# Patient Record
Sex: Female | Born: 1985 | Race: White | Hispanic: No | Marital: Single | Smoking: Current every day smoker
Health system: Southern US, Community
[De-identification: ages and names within clinical notes are randomized; demographics above are authoritative.]

## PROBLEM LIST (undated history)

## (undated) DIAGNOSIS — B1920 Unspecified viral hepatitis C without hepatic coma: Secondary | ICD-10-CM

## (undated) DIAGNOSIS — I38 Endocarditis, valve unspecified: Secondary | ICD-10-CM

## (undated) DIAGNOSIS — N809 Endometriosis, unspecified: Secondary | ICD-10-CM

## (undated) DIAGNOSIS — F1910 Other psychoactive substance abuse, uncomplicated: Secondary | ICD-10-CM

## (undated) HISTORY — PX: EXTERNAL FIXATION ARM: SHX1552

---
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
## 2003-03-28 ENCOUNTER — Emergency Department (HOSPITAL_COMMUNITY): Admission: EM | Admit: 2003-03-28 | Discharge: 2003-03-28 | Payer: Self-pay | Admitting: Emergency Medicine

## 2003-03-28 ENCOUNTER — Encounter: Payer: Self-pay | Admitting: Emergency Medicine

## 2004-07-05 ENCOUNTER — Ambulatory Visit (HOSPITAL_COMMUNITY): Admission: RE | Admit: 2004-07-05 | Discharge: 2004-07-05 | Payer: Self-pay | Admitting: *Deleted

## 2004-11-28 ENCOUNTER — Inpatient Hospital Stay (HOSPITAL_COMMUNITY): Admission: AD | Admit: 2004-11-28 | Discharge: 2004-11-28 | Payer: Self-pay | Admitting: *Deleted

## 2004-12-04 ENCOUNTER — Inpatient Hospital Stay (HOSPITAL_COMMUNITY): Admission: AD | Admit: 2004-12-04 | Discharge: 2004-12-07 | Payer: Self-pay | Admitting: Obstetrics & Gynecology

## 2004-12-10 ENCOUNTER — Ambulatory Visit: Payer: Self-pay | Admitting: Family Medicine

## 2004-12-10 ENCOUNTER — Inpatient Hospital Stay (HOSPITAL_COMMUNITY): Admission: AD | Admit: 2004-12-10 | Discharge: 2004-12-13 | Payer: Self-pay | Admitting: Family Medicine

## 2005-05-17 ENCOUNTER — Emergency Department (HOSPITAL_COMMUNITY): Admission: EM | Admit: 2005-05-17 | Discharge: 2005-05-17 | Payer: Self-pay | Admitting: Family Medicine

## 2008-02-23 ENCOUNTER — Emergency Department (HOSPITAL_COMMUNITY): Admission: EM | Admit: 2008-02-23 | Discharge: 2008-02-23 | Payer: Self-pay | Admitting: Emergency Medicine

## 2008-10-16 ENCOUNTER — Emergency Department (HOSPITAL_COMMUNITY): Admission: EM | Admit: 2008-10-16 | Discharge: 2008-10-16 | Payer: Self-pay | Admitting: Emergency Medicine

## 2010-02-01 ENCOUNTER — Emergency Department (HOSPITAL_COMMUNITY): Admission: EM | Admit: 2010-02-01 | Discharge: 2010-02-02 | Payer: Self-pay | Admitting: Emergency Medicine

## 2010-06-05 ENCOUNTER — Emergency Department: Payer: Self-pay | Admitting: Internal Medicine

## 2010-06-09 ENCOUNTER — Emergency Department: Payer: Self-pay | Admitting: Emergency Medicine

## 2010-12-04 LAB — CBC
HCT: 38.1 % (ref 36.0–46.0)
Hemoglobin: 13.6 g/dL (ref 12.0–15.0)
MCHC: 35.6 g/dL (ref 30.0–36.0)
MCV: 85.1 fL (ref 78.0–100.0)
Platelets: 227 10*3/uL (ref 150–400)
RBC: 4.48 MIL/uL (ref 3.87–5.11)
RDW: 12.3 % (ref 11.5–15.5)
WBC: 11.1 10*3/uL — ABNORMAL HIGH (ref 4.0–10.5)

## 2010-12-04 LAB — DIFFERENTIAL
Basophils Absolute: 0 10*3/uL (ref 0.0–0.1)
Basophils Relative: 0 % (ref 0–1)
Eosinophils Absolute: 0.1 10*3/uL (ref 0.0–0.7)
Eosinophils Relative: 1 % (ref 0–5)
Lymphocytes Relative: 14 % (ref 12–46)
Lymphs Abs: 1.6 10*3/uL (ref 0.7–4.0)
Monocytes Absolute: 0.5 10*3/uL (ref 0.1–1.0)
Monocytes Relative: 4 % (ref 3–12)
Neutro Abs: 8.9 10*3/uL — ABNORMAL HIGH (ref 1.7–7.7)
Neutrophils Relative %: 80 % — ABNORMAL HIGH (ref 43–77)

## 2010-12-04 LAB — URINALYSIS, ROUTINE W REFLEX MICROSCOPIC
Bilirubin Urine: NEGATIVE
Glucose, UA: NEGATIVE mg/dL
Hgb urine dipstick: NEGATIVE
Ketones, ur: 15 mg/dL — AB
Nitrite: NEGATIVE
Protein, ur: NEGATIVE mg/dL
Specific Gravity, Urine: 1.014 (ref 1.005–1.030)
Urobilinogen, UA: 1 mg/dL (ref 0.0–1.0)
pH: 7.5 (ref 5.0–8.0)

## 2010-12-04 LAB — BASIC METABOLIC PANEL
BUN: 9 mg/dL (ref 6–23)
CO2: 26 mEq/L (ref 19–32)
Calcium: 9.1 mg/dL (ref 8.4–10.5)
Chloride: 106 mEq/L (ref 96–112)
Creatinine, Ser: 0.74 mg/dL (ref 0.4–1.2)
GFR calc Af Amer: >60 mL/min (ref >60)
GFR calc non Af Amer: >60 mL/min (ref >60)
Glucose, Bld: 107 mg/dL — ABNORMAL HIGH (ref 70–99)
Potassium: 4.1 mEq/L (ref 3.5–5.1)
Sodium: 136 mEq/L (ref 135–145)

## 2010-12-04 LAB — PREGNANCY, URINE: Preg Test, Ur: NEGATIVE

## 2011-01-08 ENCOUNTER — Emergency Department (HOSPITAL_BASED_OUTPATIENT_CLINIC_OR_DEPARTMENT_OTHER)
Admission: EM | Admit: 2011-01-08 | Discharge: 2011-01-08 | Disposition: A | Discharge status: Home / Self Care | Payer: Medicaid Other | Attending: Emergency Medicine | Admitting: Emergency Medicine

## 2011-01-08 DIAGNOSIS — K089 Disorder of teeth and supporting structures, unspecified: Secondary | ICD-10-CM | POA: Insufficient documentation

## 2011-01-08 DIAGNOSIS — K029 Dental caries, unspecified: Secondary | ICD-10-CM | POA: Insufficient documentation

## 2011-01-08 DIAGNOSIS — F17200 Nicotine dependence, unspecified, uncomplicated: Secondary | ICD-10-CM | POA: Insufficient documentation

## 2011-02-02 NOTE — Discharge Summary (Signed)
NAMEMANAMI, TUTOR           ACCOUNT NO.:  [id number]   MEDICAL RECORD NO.:  [id number]          PATIENT TYPE:  INP   LOCATION:  9304                          FACILITY:  [facility]   PHYSICIAN:  Jon Gills, M.D.  DATE OF BIRTH:  1986-06-29   DATE OF ADMISSION:  12/10/2004  DATE OF DISCHARGE:  12/13/2004                                 DISCHARGE SUMMARY   ADMISSION DIAGNOSES:  10.  A 25 year old G2 P1-0-1-1, postpartum day seven status post vaginal      delivery with second-degree laceration and repair.  2.  Fevers.  3.  Copious vaginal discharge with odor.   DISCHARGE DIAGNOSES:  65.  A 25 year old G2 P1-0-1-1, postpartum day 10, status post vaginal      delivery with second-degree and repair.  2.  Postpartum wound infection.   DISCHARGE MEDICATIONS:  1.  Ceftin 500 mg p.o. q.8 h. x 7 days.  2.  Prenatal vitamin 1 p.o. daily.  3.  Ibuprofen 600 mg q.6 h. p.r.n.   ADMISSION HISTORY:  Maria Abbott was admitted to the hospital with fevers  to 102 and a vaginal discharge with odor.  There was minimal breakdown of  her perineal repair, with no obvious hematoma.  She was placed on IV  antibiotics.   HOSPITAL COURSE:  She defervesced 24 hours after being on IV antibiotics.  Urine culture showed no growth.  GC and Chlamydia were negative.  Vaginal  wound culture was consistent with normal vaginal flora and showed gram  negative rods and a few gram positive cocci.   CONDITION ON DISCHARGE:  The patient was discharged to home in stable  condition.   The patient was told to follow up at [facility] and return to [facility] if she  continued to have fevers or abnormal vaginal discharge.  She was told not to  do any heavy lifting for four weeks, and no intercourse for six weeks.      LC/MEDQ  D:  12/13/2004  T:  12/13/2004  Job:  [id number]

## 2011-03-14 ENCOUNTER — Emergency Department: Payer: Self-pay | Admitting: Unknown Physician Specialty

## 2011-04-29 ENCOUNTER — Encounter: Payer: Self-pay | Admitting: *Deleted

## 2011-04-29 ENCOUNTER — Emergency Department (HOSPITAL_BASED_OUTPATIENT_CLINIC_OR_DEPARTMENT_OTHER)
Admission: EM | Admit: 2011-04-29 | Discharge: 2011-04-29 | Disposition: A | Discharge status: Home / Self Care | Payer: Medicaid Other | Attending: Emergency Medicine | Admitting: Emergency Medicine

## 2011-04-29 DIAGNOSIS — N390 Urinary tract infection, site not specified: Secondary | ICD-10-CM

## 2011-04-29 DIAGNOSIS — R300 Dysuria: Secondary | ICD-10-CM | POA: Insufficient documentation

## 2011-04-29 LAB — URINALYSIS, ROUTINE W REFLEX MICROSCOPIC
Bilirubin Urine: NEGATIVE
Glucose, UA: NEGATIVE mg/dL
Ketones, ur: NEGATIVE mg/dL
Nitrite: NEGATIVE
Protein, ur: 30 mg/dL — AB
Specific Gravity, Urine: 1.009 (ref 1.005–1.030)
Urobilinogen, UA: 0.2 mg/dL (ref 0.0–1.0)
pH: 6 (ref 5.0–8.0)

## 2011-04-29 LAB — URINE MICROSCOPIC-ADD ON

## 2011-04-29 LAB — PREGNANCY, URINE: Preg Test, Ur: NEGATIVE

## 2011-04-29 MED ORDER — CIPROFLOXACIN HCL 500 MG PO TABS
500.0000 mg | ORAL_TABLET | Freq: Once | ORAL | Status: AC
  Administered 2011-04-29: 500 mg via ORAL
  Filled 2011-04-29: qty 1

## 2011-04-29 MED ORDER — PHENAZOPYRIDINE HCL 100 MG PO TABS
200.0000 mg | ORAL_TABLET | Freq: Once | ORAL | Status: AC
  Administered 2011-04-29: 200 mg via ORAL
  Filled 2011-04-29: qty 2

## 2011-04-29 MED ORDER — CIPROFLOXACIN HCL 500 MG PO TABS: 500.0000 mg | ORAL_TABLET | Freq: Two times a day (BID) | ORAL | Status: AC

## 2011-04-29 MED ORDER — NAPROXEN SODIUM 220 MG PO TABS: 220.0000 mg | ORAL_TABLET | Freq: Two times a day (BID) | ORAL | Status: DC

## 2011-04-29 MED ORDER — PHENAZOPYRIDINE HCL 100 MG PO TABS: 100.0000 mg | ORAL_TABLET | Freq: Three times a day (TID) | ORAL | Status: AC | PRN

## 2011-04-29 MED ORDER — NAPROXEN 250 MG PO TABS
250.0000 mg | ORAL_TABLET | Freq: Once | ORAL | Status: AC
  Administered 2011-04-29: 250 mg via ORAL
  Filled 2011-04-29: qty 1

## 2011-04-29 NOTE — ED Provider Notes (Signed)
History   Scribed for Olivia Mackie, MD, the patient was seen in room MH06/MH06 . This chart was scribed by Desma Paganini. This patient's care was started at 7:27 PM   CSN: [id number] Arrival date & time: 04/29/2011  7:23 PM  Chief Complaint  Patient presents with  . Urinary Frequency   HPI Maria Abbott is a 25 y.o. female who presents to the Emergency Department complaining of dysuria. Pt reports painful and frequent urination x 2 days. The pt has a h/o UTI with these same sx (last 8 months ago) and normally takes Azo-cranberry to relieve sx of dysuria, frequency, and mild suprapubic pain; tried this time with no relief. Pt states she had a fever of 100.3 yesterday but no fever today after taking tylenol. Denies back pain, n/v, and vaginal bleeding or discharge. Pt was last on abx for UTI approx 1 year ago. She currently uses a IUD, does not have NMP, and reports 1 sexual partner (spouse).  HPI ELEMENTS: Location: suprapubic Onset: 2 days ago Duration: 2 days ago   Timing: constant Severity: moderate Modifying factors:Took Tylenol with relief of fever; not improved with azo-cranberry Context:  as above  Associated symptoms: urinary frequency, mild suprapubic pain   PAST MEDICAL HISTORY:  History reviewed. No pertinent past medical history.   PAST SURGICAL HISTORY:  Past Surgical History  Procedure Date  . External fixation arm      MEDICATIONS:  Previous Medications   No medications on file     ALLERGIES:  Allergies as of 04/29/2011  . (Not on File)     FAMILY HISTORY:  History reviewed. No pertinent family history.   SOCIAL HISTORY: Lives with spouse and 2 children Doesn't use condoms   Review of Systems 10 Systems reviewed and are negative for acute change except as noted in the HPI.  Physical Exam  BP 94/75  Pulse 73  Temp(Src) 98.8 F (37.1 C) (Oral)  Resp 18  Ht 5' 3.5" (1.613 m)  Wt 128 lb (58.06 kg)  BMI 22.32 kg/m2  SpO2 99%  Physical  Exam  Nursing note and vitals reviewed. Constitutional: She is oriented to person, place, and time. She appears well-developed and well-nourished.  HENT:  Head: Normocephalic and atraumatic.  Eyes: EOM are normal. Pupils are equal, round, and reactive to light.  Neck: Normal range of motion. Neck supple.  Cardiovascular: Normal rate, normal heart sounds and intact distal pulses.   Pulmonary/Chest: Effort normal and breath sounds normal.  Abdominal: Soft. Bowel sounds are normal. She exhibits no distension and no mass. There is no tenderness. There is no CVA tenderness.  Genitourinary:       Suprapubic tenderness   Musculoskeletal: Normal range of motion. She exhibits no edema and no tenderness.  Neurological: She is alert and oriented to person, place, and time. No cranial nerve deficit.  Skin: Skin is warm and dry. No rash noted.  Psychiatric: She has a normal mood and affect.    ED Course  Procedures  OTHER DATA REVIEWED: Nursing notes and vital signs reviewed.  LABS / RADIOLOGY:  Results for orders placed during the hospital encounter of 04/29/11  PREGNANCY, URINE      Component Value Range   Preg Test, Ur NEGATIVE    URINALYSIS, ROUTINE W REFLEX MICROSCOPIC      Component Value Range   Color, Urine YELLOW  YELLOW    Appearance CLOUDY (*) CLEAR    Specific Gravity, Urine 1.009  1.005 - 1.030  pH 6.0  5.0 - 8.0    Glucose, UA NEGATIVE  NEGATIVE (mg/dL)   Hgb urine dipstick LARGE (*) NEGATIVE    Bilirubin Urine NEGATIVE  NEGATIVE    Ketones, ur NEGATIVE  NEGATIVE (mg/dL)   Protein, ur 30 (*) NEGATIVE (mg/dL)   Urobilinogen, UA 0.2  0.0 - 1.0 (mg/dL)   Nitrite NEGATIVE  NEGATIVE    Leukocytes, UA LARGE (*) NEGATIVE   URINE MICROSCOPIC-ADD ON      Component Value Range   Squamous Epithelial / LPF FEW (*) RARE    WBC, UA TOO NUMEROUS TO COUNT  <3 (WBC/hpf)   RBC / HPF TOO NUMEROUS TO COUNT  <3 (RBC/hpf)   Bacteria, UA MANY (*) RARE    .    MDM: Pt with 2 days of  dysuria, low grade temp.  No signs of serious bacterial infection on exam, no clinical pyelonephritis.  Will treat with cipro.    IMPRESSION: Urinary tract infection   PLAN:  Discharge home  The patient is to return the emergency department if there is any worsening of symptoms. I have reviewed the discharge instructions with the patient   CONDITION ON DISCHARGE: Stable   MEDICATIONS GIVEN IN THE E.D. Medications - No data to display   DISCHARGE MEDICATIONS: New Prescriptions   No medications on file           Olivia Mackie, MD 04/29/11 2037

## 2011-04-29 NOTE — ED Notes (Signed)
Pt states that she would like to get some "strong pain medication" to help with the pain because this UTI has been very painful and she would like to be able to sleep tonight.  I explained to the patient that narcotic pain medications are not indicated for the treatment of UTI's and that she will obtain greater relief with fewer side effects by using pyridium and antibiotics.

## 2011-04-29 NOTE — ED Notes (Signed)
Pt states she has had painful and frequent urination for 2 days. Normally takes Azo-Cranberry brand with relief, but not this time.

## 2011-05-01 LAB — URINE CULTURE
Colony Count: >=100000
Culture  Setup Time: 201208130019

## 2011-05-02 NOTE — ED Notes (Signed)
+   urine culture Patient treated with Cipro-sensitive to same-chart appended per protocol MD.

## 2011-06-30 ENCOUNTER — Encounter (HOSPITAL_BASED_OUTPATIENT_CLINIC_OR_DEPARTMENT_OTHER): Payer: Self-pay | Admitting: *Deleted

## 2011-06-30 ENCOUNTER — Emergency Department (HOSPITAL_BASED_OUTPATIENT_CLINIC_OR_DEPARTMENT_OTHER)
Admission: EM | Admit: 2011-06-30 | Discharge: 2011-07-01 | Disposition: A | Discharge status: Home / Self Care | Payer: Medicaid Other | Attending: Emergency Medicine | Admitting: Emergency Medicine

## 2011-06-30 DIAGNOSIS — Y9239 Other specified sports and athletic area as the place of occurrence of the external cause: Secondary | ICD-10-CM | POA: Insufficient documentation

## 2011-06-30 DIAGNOSIS — W010XXA Fall on same level from slipping, tripping and stumbling without subsequent striking against object, initial encounter: Secondary | ICD-10-CM | POA: Insufficient documentation

## 2011-06-30 DIAGNOSIS — Y9321 Activity, ice skating: Secondary | ICD-10-CM | POA: Insufficient documentation

## 2011-06-30 DIAGNOSIS — S300XXA Contusion of lower back and pelvis, initial encounter: Secondary | ICD-10-CM | POA: Insufficient documentation

## 2011-06-30 DIAGNOSIS — Y92838 Other recreation area as the place of occurrence of the external cause: Secondary | ICD-10-CM | POA: Insufficient documentation

## 2011-06-30 DIAGNOSIS — F17200 Nicotine dependence, unspecified, uncomplicated: Secondary | ICD-10-CM | POA: Insufficient documentation

## 2011-06-30 NOTE — ED Notes (Signed)
Pt states she fell at the skating rink and injured her mid-lower back.

## 2011-07-01 ENCOUNTER — Emergency Department (INDEPENDENT_AMBULATORY_CARE_PROVIDER_SITE_OTHER): Payer: Medicaid Other

## 2011-07-01 DIAGNOSIS — W19XXXA Unspecified fall, initial encounter: Secondary | ICD-10-CM

## 2011-07-01 DIAGNOSIS — M545 Low back pain: Secondary | ICD-10-CM

## 2011-07-01 MED ORDER — OXYCODONE-ACETAMINOPHEN 5-325 MG PO TABS
2.0000 | ORAL_TABLET | Freq: Once | ORAL | Status: AC
  Administered 2011-07-01: 2 via ORAL
  Filled 2011-07-01: qty 2

## 2011-07-01 MED ORDER — OXYCODONE-ACETAMINOPHEN 5-325 MG PO TABS: 1.0000 | ORAL_TABLET | Freq: Four times a day (QID) | ORAL | Status: AC | PRN

## 2011-07-01 NOTE — ED Provider Notes (Signed)
History    Scribed for Hanley Seamen, MD, the patient was seen in room MH01/MH01. This chart was scribed by Katha Cabal. This patient's care was started at 12:48 PM.    CSN: [id number] Arrival date & time: 06/30/2011 11:35 PM  Chief Complaint  Patient presents with  . Fall    (Consider location/radiation/quality/duration/timing/severity/associated sxs/prior treatment) HPI Maria Abbott is a 25 y.o. female who presents to the Emergency Department for sudden fall with associated constant mid to lower back pain that occured while ice skating tonight at 10 PM.  Denies EtOH use.  Pain is aggravated by walking. Patient states that she fell backward and her feet went up into air landing on bottom of spine.    History reviewed. No pertinent past medical history.  Past Surgical History  Procedure Date  . External fixation arm     History reviewed. No pertinent family history.  History  Substance Use Topics  . Smoking status: Current Everyday Smoker -- 0.5 packs/day  . Smokeless tobacco: Not on file  . Alcohol Use: No    OB History    Grav Para Term Preterm Abortions TAB SAB Ect Mult Living                  Review of Systems  All other systems reviewed and are negative.    Allergies  Codeine; Penicillins; and Sulfa antibiotics  Home Medications   Current Outpatient Rx  Name Route Sig Dispense Refill  . ACETAMINOPHEN 500 MG PO TABS Oral Take 1,000 mg by mouth every 6 (six) hours as needed. For pain     . LEVONORGESTREL 20 MCG/24HR IU IUD Intrauterine 1 each by Intrauterine route once.      Marland Kitchen NAPROXEN SODIUM 220 MG PO TABS Oral Take 1 tablet (220 mg total) by mouth 2 (two) times daily with a meal. 20 tablet 0    BP 113/63  Pulse 98  Temp(Src) 97.9 F (36.6 C) (Oral)  Resp 10  Ht 5' 2.5" (1.588 m)  Wt 135 lb (61.236 kg)  BMI 24.30 kg/m2  SpO2 100%  Physical Exam General: Well-developed, well-nourished female in no acute distress; appearance consistent  with age of record HENT: normocephalic, atraumatic Eyes: pupils equal round and reactive to light; extraocular muscles intact Neck: supple Heart: regular rate and rhythm; no murmurs, rubs or gallops Lungs: clear to auscultation bilaterally Abdomen: soft; nontender; nondistended; no masses or hepatosplenomegaly;  Extremities: No deformity; full range of motion; pulses normal  Tenderness to lower sacrum and coccyx.  No palpable deformity, no crepitus.   Neurologic: Awake, alert and oriented;motor function intact in all extremities and symmetric; no facial droop,   No sensory deficit.   Skin: Warm and dry Psychiatric: Normal mood and affect   ED Course  Procedures (including critical care time)   DIAGNOSTIC STUDIES: Oxygen Saturation is 100% on room air, normal by my interpretation.    COORDINATION OF CARE:  12:54 AM  Physical exam complete.  Will review Sacrum/Coccyx XR.    Nursing notes and vitals signs, including pulse oximetry, reviewed.  Summary of this visit's results, reviewed by myself:   Imaging Studies: Dg Sacrum/coccyx  07/01/2011  *RADIOLOGY REPORT*  Clinical Data: Lower back pain after fall  SACRUM AND COCCYX - 2+ VIEW  Comparison: None.  Findings: The sacral struts appear intact.  SI joints and symphysis pubis are not widened.  Sacral coccygeal spine appears intact without depression on the lateral view.  Metallic structure consistent with intrauterine  device.  IMPRESSION: No acute bony abnormalities demonstrated.  Original Report Authenticated By: Marlon Pel, M.D.             Hanley Seamen, MD 07/01/11 [phone]

## 2011-08-02 ENCOUNTER — Emergency Department (HOSPITAL_BASED_OUTPATIENT_CLINIC_OR_DEPARTMENT_OTHER)
Admission: EM | Admit: 2011-08-02 | Discharge: 2011-08-02 | Disposition: A | Discharge status: Home / Self Care | Payer: Medicaid Other | Attending: Emergency Medicine | Admitting: Emergency Medicine

## 2011-08-02 ENCOUNTER — Encounter (HOSPITAL_BASED_OUTPATIENT_CLINIC_OR_DEPARTMENT_OTHER): Payer: Self-pay | Admitting: Emergency Medicine

## 2011-08-02 DIAGNOSIS — R319 Hematuria, unspecified: Secondary | ICD-10-CM | POA: Insufficient documentation

## 2011-08-02 DIAGNOSIS — R350 Frequency of micturition: Secondary | ICD-10-CM | POA: Insufficient documentation

## 2011-08-02 DIAGNOSIS — N760 Acute vaginitis: Secondary | ICD-10-CM

## 2011-08-02 DIAGNOSIS — B9689 Other specified bacterial agents as the cause of diseases classified elsewhere: Secondary | ICD-10-CM | POA: Insufficient documentation

## 2011-08-02 DIAGNOSIS — F17200 Nicotine dependence, unspecified, uncomplicated: Secondary | ICD-10-CM | POA: Insufficient documentation

## 2011-08-02 DIAGNOSIS — A499 Bacterial infection, unspecified: Secondary | ICD-10-CM | POA: Insufficient documentation

## 2011-08-02 LAB — URINALYSIS, ROUTINE W REFLEX MICROSCOPIC
Bilirubin Urine: NEGATIVE
Glucose, UA: NEGATIVE mg/dL
Ketones, ur: NEGATIVE mg/dL
Nitrite: NEGATIVE
Protein, ur: NEGATIVE mg/dL
Specific Gravity, Urine: 1.004 — ABNORMAL LOW (ref 1.005–1.030)
Urobilinogen, UA: 0.2 mg/dL (ref 0.0–1.0)
pH: 6 (ref 5.0–8.0)

## 2011-08-02 LAB — WET PREP, GENITAL
Trich, Wet Prep: NONE SEEN
Yeast Wet Prep HPF POC: NONE SEEN

## 2011-08-02 LAB — URINE MICROSCOPIC-ADD ON

## 2011-08-02 LAB — PREGNANCY, URINE: Preg Test, Ur: NEGATIVE

## 2011-08-02 MED ORDER — MICONAZOLE NITRATE 1200 & 2 MG & % VA KIT: 1.0000 | PACK | Freq: Once | VAGINAL | Status: AC

## 2011-08-02 NOTE — ED Provider Notes (Signed)
History     CSN: [id number] Arrival date & time: 08/02/2011 10:37 PM   First MD Initiated Contact with Patient 08/02/11 2258      Chief Complaint  Patient presents with  . Urinary Frequency  . Hematuria    (Consider location/radiation/quality/duration/timing/severity/associated sxs/prior treatment) HPI 25 year old female presents to emergency department with complaint of one day of urinary frequency, dysuria, and blood seen in urine. Patient reports symptoms are similar to prior UTI. Patient denies any new sexual partners, no vaginal discharge. No fevers no back pain History reviewed. No pertinent past medical history.  Past Surgical History  Procedure Date  . External fixation arm     No family history on file.  History  Substance Use Topics  . Smoking status: Current Everyday Smoker -- 0.5 packs/day  . Smokeless tobacco: Not on file  . Alcohol Use: No    OB History    Grav Para Term Preterm Abortions TAB SAB Ect Mult Living                  Review of Systems  All other systems reviewed and are negative.    Allergies  Codeine; Penicillins; and Sulfa antibiotics  Home Medications   Current Outpatient Rx  Name Route Sig Dispense Refill  . ACETAMINOPHEN 500 MG PO TABS Oral Take 1,000 mg by mouth every 6 (six) hours as needed. For pain    . PHENAZOPYRIDINE HCL 100 MG PO TABS Oral Take 100 mg by mouth 3 (three) times daily as needed. For UTI     . LEVONORGESTREL 20 MCG/24HR IU IUD Intrauterine 1 each by Intrauterine route once. Inserted in 2008      BP 113/74  Pulse 94  Temp(Src) 97.3 F (36.3 C) (Oral)  Resp 18  SpO2 100%  Physical Exam  Constitutional: She is oriented to person, place, and time. She appears well-developed and well-nourished.  HENT:  Head: Normocephalic and atraumatic.  Nose: Nose normal.  Mouth/Throat: Oropharynx is clear and moist.  Eyes: Conjunctivae and EOM are normal. Pupils are equal, round, and reactive to light.  Neck:  Normal range of motion. Neck supple. No JVD present. No tracheal deviation present. No thyromegaly present.  Cardiovascular: Normal rate, regular rhythm, normal heart sounds and intact distal pulses.  Exam reveals no gallop and no friction rub.   No murmur heard. Pulmonary/Chest: Effort normal and breath sounds normal. No stridor. No respiratory distress. She has no wheezes. She has no rales. She exhibits no tenderness.  Abdominal: Soft. Bowel sounds are normal. She exhibits no distension and no mass. There is no tenderness. There is no rebound and no guarding.  Genitourinary: Uterus normal. Vaginal discharge found.       Vagina noted to have thick white discharge and clumps consistent with yeast. No adnexal tenderness no uterine tenderness  Musculoskeletal: Normal range of motion. She exhibits no edema and no tenderness.  Lymphadenopathy:    She has no cervical adenopathy.  Neurological: She is oriented to person, place, and time. She has normal reflexes. No cranial nerve deficit. She exhibits normal muscle tone. Coordination normal.  Skin: Skin is dry. No rash noted. No erythema. No pallor.  Psychiatric: She has a normal mood and affect. Her behavior is normal. Judgment and thought content normal.    ED Course  Procedures (including critical care time)  Labs Reviewed  URINALYSIS, ROUTINE W REFLEX MICROSCOPIC - Abnormal; Notable for the following:    Specific Gravity, Urine 1.004 (*)    Hgb urine  dipstick SMALL (*)    Leukocytes, UA TRACE (*)    All other components within normal limits  WET PREP, GENITAL - Abnormal; Notable for the following:    Clue Cells, Wet Prep FEW (*)    WBC, Wet Prep HPF POC MANY (*)    All other components within normal limits  URINE MICROSCOPIC-ADD ON  PREGNANCY, URINE  URINE CULTURE  GC/CHLAMYDIA PROBE AMP, GENITAL   No results found.   No diagnosis found.    MDM  24 year old female with essentially normal urine, will send for cultures given her  symptoms. Wet prep does show yeast, however exam consistent with same. Will place her on over-the-counter Monistat and have her followup with her primary care Dr.        Olivia Mackie, MD 08/02/11 [phone]

## 2011-08-02 NOTE — ED Notes (Signed)
Pt c/o urinary frequency, pain with urination and hematuria

## 2011-08-02 NOTE — ED Notes (Signed)
Warm blankets given.

## 2011-08-04 LAB — URINE CULTURE
Colony Count: NO GROWTH
Culture  Setup Time: 201211160628
Culture: NO GROWTH

## 2011-08-04 LAB — GC/CHLAMYDIA PROBE AMP, GENITAL: GC Probe Amp, Genital: NEGATIVE

## 2011-10-18 ENCOUNTER — Emergency Department (INDEPENDENT_AMBULATORY_CARE_PROVIDER_SITE_OTHER): Payer: Medicaid Other

## 2011-10-18 ENCOUNTER — Encounter (HOSPITAL_BASED_OUTPATIENT_CLINIC_OR_DEPARTMENT_OTHER): Payer: Self-pay | Admitting: *Deleted

## 2011-10-18 ENCOUNTER — Emergency Department (HOSPITAL_BASED_OUTPATIENT_CLINIC_OR_DEPARTMENT_OTHER)
Admission: EM | Admit: 2011-10-18 | Discharge: 2011-10-18 | Disposition: A | Discharge status: Home / Self Care | Payer: Medicaid Other | Attending: Emergency Medicine | Admitting: Emergency Medicine

## 2011-10-18 DIAGNOSIS — M533 Sacrococcygeal disorders, not elsewhere classified: Secondary | ICD-10-CM | POA: Insufficient documentation

## 2011-10-18 DIAGNOSIS — F17200 Nicotine dependence, unspecified, uncomplicated: Secondary | ICD-10-CM | POA: Insufficient documentation

## 2011-10-18 DIAGNOSIS — W108XXA Fall (on) (from) other stairs and steps, initial encounter: Secondary | ICD-10-CM | POA: Insufficient documentation

## 2011-10-18 DIAGNOSIS — IMO0002 Reserved for concepts with insufficient information to code with codable children: Secondary | ICD-10-CM

## 2011-10-18 DIAGNOSIS — IMO0001 Reserved for inherently not codable concepts without codable children: Secondary | ICD-10-CM

## 2011-10-18 MED ORDER — HYDROCODONE-ACETAMINOPHEN 5-325 MG PO TABS
1.0000 | ORAL_TABLET | Freq: Once | ORAL | Status: AC
  Administered 2011-10-18: 1 via ORAL
  Filled 2011-10-18: qty 1

## 2011-10-18 MED ORDER — HYDROCODONE-ACETAMINOPHEN 5-500 MG PO TABS: 1.0000 | ORAL_TABLET | Freq: Four times a day (QID) | ORAL | Status: AC | PRN

## 2011-10-18 NOTE — ED Notes (Signed)
Fell down stairs yesterday injury to her lower back. Fell in October and hurt her coccyx. Has an appointment to a specialist February.

## 2011-10-18 NOTE — ED Provider Notes (Signed)
History     CSN: [id number]  Arrival date & time 10/18/11  1753   First MD Initiated Contact with Patient 10/18/11 1823      Chief Complaint  Patient presents with  . Fall    (Consider location/radiation/quality/duration/timing/severity/associated sxs/prior treatment) HPI Comments: Pt states that fell a couple of months ago and had and injury to the area  Patient is a 26 y.o. female presenting with fall. The history is provided by the patient. No language interpreter was used.  Fall The accident occurred yesterday. Incident: fell down some stairs. She landed on a hard floor. There was no blood loss. Point of impact: buttock. Pain location: buttock. The pain is moderate. She was ambulatory at the scene. There was no entrapment after the fall. There was no drug use involved in the accident. There was no alcohol use involved in the accident. Pertinent negatives include no nausea, no vomiting and no loss of consciousness. The symptoms are aggravated by activity. She has tried nothing for the symptoms.    History reviewed. No pertinent past medical history.  Past Surgical History  Procedure Date  . External fixation arm     No family history on file.  History  Substance Use Topics  . Smoking status: Current Everyday Smoker -- 0.5 packs/day  . Smokeless tobacco: Not on file  . Alcohol Use: No    OB History    Grav Para Term Preterm Abortions TAB SAB Ect Mult Living                  Review of Systems  Gastrointestinal: Negative for nausea and vomiting.  Neurological: Negative for loss of consciousness.  All other systems reviewed and are negative.    Allergies  Codeine; Penicillins; and Sulfa antibiotics  Home Medications   Current Outpatient Rx  Name Route Sig Dispense Refill  . ACETAMINOPHEN 500 MG PO TABS Oral Take 1,000 mg by mouth every 6 (six) hours as needed. For pain    . IBUPROFEN 200 MG PO TABS Oral Take 400 mg by mouth every 8 (eight) hours as needed.  For pain    . LEVONORGESTREL 20 MCG/24HR IU IUD Intrauterine 1 each by Intrauterine route once. Inserted in 2008      BP 101/69  Pulse 80  Temp(Src) 98.1 F (36.7 C) (Oral)  Resp 20  SpO2 99%  Physical Exam  Nursing note and vitals reviewed. Constitutional: She is oriented to person, place, and time. She appears well-developed and well-nourished.  HENT:  Head: Normocephalic and atraumatic.  Eyes: EOM are normal.  Cardiovascular: Normal rate and regular rhythm.   Pulmonary/Chest: Effort normal and breath sounds normal.  Musculoskeletal:       Cervical back: Normal.       Thoracic back: Normal.       Lumbar back: Normal.       Back:  Neurological: She is alert and oriented to person, place, and time.  Skin: Skin is warm and dry.  Psychiatric: She has a normal mood and affect.    ED Course  Procedures (including critical care time)  Labs Reviewed - No data to display No results found.   1. Coccyx pain       MDM  Previous x-ray was negative:pt is okay to follow up with pcp        Teressa Lower, NP 10/18/11 1946

## 2011-10-19 NOTE — ED Provider Notes (Signed)
Medical screening examination/treatment/procedure(s) were performed by non-physician practitioner and as supervising physician I was immediately available for consultation/collaboration.   Dione Booze, MD 10/19/11 0000

## 2012-05-29 ENCOUNTER — Encounter (HOSPITAL_BASED_OUTPATIENT_CLINIC_OR_DEPARTMENT_OTHER): Payer: Self-pay | Admitting: *Deleted

## 2012-05-29 ENCOUNTER — Emergency Department (HOSPITAL_BASED_OUTPATIENT_CLINIC_OR_DEPARTMENT_OTHER)
Admission: EM | Admit: 2012-05-29 | Discharge: 2012-05-29 | Disposition: A | Discharge status: Home / Self Care | Payer: Self-pay | Attending: Emergency Medicine | Admitting: Emergency Medicine

## 2012-05-29 DIAGNOSIS — F17200 Nicotine dependence, unspecified, uncomplicated: Secondary | ICD-10-CM | POA: Insufficient documentation

## 2012-05-29 DIAGNOSIS — N390 Urinary tract infection, site not specified: Secondary | ICD-10-CM | POA: Insufficient documentation

## 2012-05-29 LAB — URINE MICROSCOPIC-ADD ON

## 2012-05-29 LAB — URINALYSIS, ROUTINE W REFLEX MICROSCOPIC
Bilirubin Urine: NEGATIVE
Ketones, ur: NEGATIVE mg/dL
Protein, ur: NEGATIVE mg/dL
Urobilinogen, UA: 0.2 mg/dL (ref 0.0–1.0)

## 2012-05-29 MED ORDER — PHENAZOPYRIDINE HCL 200 MG PO TABS: 200.0000 mg | ORAL_TABLET | Freq: Three times a day (TID) | ORAL | Status: AC | PRN

## 2012-05-29 MED ORDER — CIPROFLOXACIN HCL 500 MG PO TABS: 500.0000 mg | ORAL_TABLET | Freq: Two times a day (BID) | ORAL | Status: AC

## 2012-05-29 NOTE — ED Provider Notes (Signed)
History     CSN: [id number]  Arrival date & time 05/29/12  1608   First MD Initiated Contact with Patient 05/29/12 1631      Chief Complaint  Patient presents with  . Urinary Tract Infection    (Consider location/radiation/quality/duration/timing/severity/associated sxs/prior treatment) HPI Comments: History of recurrent utis.  This feels the same.  Has mirena ring, doesn't have periods.  No discharge or bleeding.  Denies possibility of pregnancy.  Patient is a 26 y.o. female presenting with urinary tract infection. The history is provided by the patient.  Urinary Tract Infection This is a recurrent problem. The current episode started 2 days ago. The problem occurs constantly. The problem has been gradually worsening. Pertinent negatives include no abdominal pain. Exacerbated by: urinating. Nothing relieves the symptoms. Treatments tried: otc azo. The treatment provided no relief.    History reviewed. No pertinent past medical history.  Past Surgical History  Procedure Date  . External fixation arm     No family history on file.  History  Substance Use Topics  . Smoking status: Current Every Day Smoker -- 0.5 packs/day  . Smokeless tobacco: Not on file  . Alcohol Use: No    OB History    Grav Para Term Preterm Abortions TAB SAB Ect Mult Living                  Review of Systems  Gastrointestinal: Negative for abdominal pain.  All other systems reviewed and are negative.    Allergies  Codeine; Penicillins; and Sulfa antibiotics  Home Medications   Current Outpatient Rx  Name Route Sig Dispense Refill  . AZO-CRANBERRY PO Oral Take 2 tablets by mouth 3 (three) times daily as needed. For pain    . NAPROXEN SODIUM 220 MG PO TABS Oral Take 220 mg by mouth 2 (two) times daily as needed. For pain    . LEVONORGESTREL 20 MCG/24HR IU IUD Intrauterine 1 each by Intrauterine route once. Inserted in 2008      BP 110/65  Pulse 84  Temp 97.4 F (36.3 C) (Oral)  Resp  20  SpO2 100%  Physical Exam  Nursing note and vitals reviewed. Constitutional: She is oriented to person, place, and time. She appears well-developed and well-nourished.  HENT:  Head: Normocephalic and atraumatic.  Neck: Normal range of motion. Neck supple.  Cardiovascular: Normal rate and regular rhythm.   No murmur heard. Pulmonary/Chest: Effort normal and breath sounds normal.  Abdominal: Soft. Bowel sounds are normal.       There is mild suprapubic ttp.  No rebound or guarding.  Musculoskeletal: Normal range of motion.  Neurological: She is alert and oriented to person, place, and time.  Skin: Skin is warm and dry.    ED Course  Procedures (including critical care time)   Labs Reviewed  URINALYSIS, ROUTINE W REFLEX MICROSCOPIC  PREGNANCY, URINE   No results found.   No diagnosis found.    MDM  UA shows UTI.  Will treat with cipro, pyridium.         Geoffery Lyons, MD 05/29/12 1729

## 2012-05-29 NOTE — ED Notes (Signed)
Dysuria x 3 days. No relief with azo-cranberry which normally helps her when she gets a UTI.

## 2013-12-30 ENCOUNTER — Emergency Department (HOSPITAL_BASED_OUTPATIENT_CLINIC_OR_DEPARTMENT_OTHER)
Admission: EM | Admit: 2013-12-30 | Discharge: 2013-12-30 | Disposition: A | Discharge status: Home / Self Care | Payer: Medicaid Other | Attending: Emergency Medicine | Admitting: Emergency Medicine

## 2013-12-30 ENCOUNTER — Encounter (HOSPITAL_BASED_OUTPATIENT_CLINIC_OR_DEPARTMENT_OTHER): Payer: Self-pay | Admitting: Emergency Medicine

## 2013-12-30 DIAGNOSIS — Z3202 Encounter for pregnancy test, result negative: Secondary | ICD-10-CM | POA: Insufficient documentation

## 2013-12-30 DIAGNOSIS — F17200 Nicotine dependence, unspecified, uncomplicated: Secondary | ICD-10-CM | POA: Insufficient documentation

## 2013-12-30 DIAGNOSIS — N390 Urinary tract infection, site not specified: Secondary | ICD-10-CM

## 2013-12-30 DIAGNOSIS — Z880 Allergy status to penicillin: Secondary | ICD-10-CM | POA: Insufficient documentation

## 2013-12-30 DIAGNOSIS — K089 Disorder of teeth and supporting structures, unspecified: Secondary | ICD-10-CM | POA: Insufficient documentation

## 2013-12-30 LAB — URINALYSIS, ROUTINE W REFLEX MICROSCOPIC
GLUCOSE, UA: NEGATIVE mg/dL
KETONES UR: 15 mg/dL — AB
Nitrite: NEGATIVE
PH: 6 (ref 5.0–8.0)
Protein, ur: 30 mg/dL — AB
Specific Gravity, Urine: 1.029 (ref 1.005–1.030)
Urobilinogen, UA: 0.2 mg/dL (ref 0.0–1.0)

## 2013-12-30 LAB — URINE MICROSCOPIC-ADD ON

## 2013-12-30 LAB — PREGNANCY, URINE: Preg Test, Ur: NEGATIVE

## 2013-12-30 MED ORDER — CIPROFLOXACIN HCL 500 MG PO TABS: 500.0000 mg | ORAL_TABLET | Freq: Two times a day (BID) | ORAL | Status: DC

## 2013-12-30 NOTE — ED Notes (Signed)
Pt has UTI symptoms. She states she gets frequent uti's. Symptoms started yesterday, burning with urination, frequency

## 2013-12-30 NOTE — ED Provider Notes (Signed)
CSN: [id number]     Arrival date & time 12/30/13  [id number] History   None    Chief Complaint  Patient presents with  . Urinary Tract Infection     (Consider location/radiation/quality/duration/timing/severity/associated sxs/prior Treatment) HPI 28 year old woman with history of chronic UTIs presents with dysuria, frequency, hematuria.  She has been drinking less fluids lately due to a dental issue (dentistry follow-up already in place) which she believes caused her symptoms.  No fever, suprapubic or flank pain.  No vaginal discharge, Mirena IUD in place, in monogamous relationship x 4 years, recent STD tests negative.   History reviewed. No pertinent past medical history. Past Surgical History  Procedure Laterality Date  . External fixation arm     No family history on file. History  Substance Use Topics  . Smoking status: Current Every Day Smoker -- 0.50 packs/day  . Smokeless tobacco: Not on file  . Alcohol Use: No   OB History   Grav Para Term Preterm Abortions TAB SAB Ect Mult Living                 Review of Systems  Constitutional: Negative for fever.  HENT: Positive for dental problem.        Seeing dentist in 2 weeks  Respiratory: Negative for shortness of breath.   Cardiovascular: Negative for chest pain.  Gastrointestinal: Negative for nausea and abdominal pain.  Genitourinary: Positive for dysuria, urgency, frequency and hematuria. Negative for flank pain, vaginal discharge and pelvic pain.  Musculoskeletal: Negative for back pain.  Neurological: Negative for dizziness and syncope.    Allergies  Codeine; Penicillins; and Sulfa antibiotics  Home Medications   Prior to Admission medications   Medication Sig Start Date End Date Taking? Authorizing Provider  AZO-CRANBERRY PO Take 2 tablets by mouth 3 (three) times daily as needed. For pain    Historical Provider, MD  levonorgestrel (MIRENA) 20 MCG/24HR IUD 1 each by Intrauterine route once. Inserted in 2008     Historical Provider, MD  naproxen sodium (ANAPROX) 220 MG tablet Take 220 mg by mouth 2 (two) times daily as needed. For pain    Historical Provider, MD   BP 119/74  Pulse 92  Temp(Src) 98 F (36.7 C) (Oral)  Resp 18  SpO2 99% Physical Exam  Constitutional: She is oriented to person, place, and time. She appears well-developed and well-nourished. No distress.  HENT:  Head: Normocephalic and atraumatic.  Eyes: Conjunctivae are normal. Pupils are equal, round, and reactive to light.  Neck: Normal range of motion. Neck supple.  Cardiovascular: Normal rate and regular rhythm.   Pulmonary/Chest: Effort normal and breath sounds normal.  Abdominal: Soft. Bowel sounds are normal. There is no tenderness.  Musculoskeletal: Normal range of motion.  Neurological: She is alert and oriented to person, place, and time.  Skin: Skin is warm and dry.    ED Course  Procedures (including critical care time) Labs Review Labs Reviewed  URINALYSIS, ROUTINE W REFLEX MICROSCOPIC - Abnormal; Notable for the following:    APPearance CLOUDY (*)    Hgb urine dipstick LARGE (*)    Bilirubin Urine SMALL (*)    Ketones, ur 15 (*)    Protein, ur 30 (*)    Leukocytes, UA LARGE (*)    All other components within normal limits  URINE MICROSCOPIC-ADD ON - Abnormal; Notable for the following:    Squamous Epithelial / LPF FEW (*)    Bacteria, UA MANY (*)    All other  components within normal limits  PREGNANCY, URINE    Imaging Review No results found.   EKG Interpretation None      MDM   Final diagnoses:  UTI (lower urinary tract infection)  UTI- Patient presents with dysuria, frequency, hematuria, has chronic UTIs, usually treated with cipro.  UA showed large leucocytes, large heme, WBC TNTC, many bacteria.  Urine pregnancy negative.   Discharged with ciprofloxacin 500 mg BID x 7 days, PCP follow-up.    Rocco SereneMorgan Charles Niese, MD 12/30/13 [phone]

## 2013-12-30 NOTE — ED Provider Notes (Signed)
I have personally seen and examined the patient.  I have discussed the plan of care with the resident.  I have reviewed the documentation on PMH/FH/Soc. History.  I have reviewed the documentation of the resident and agree.   Joya Gaskinsonald W Ilma Achee, MD 12/30/13 1106

## 2013-12-30 NOTE — ED Provider Notes (Signed)
Patient seen/examined in the Emergency Department in conjunction with Resident Physician Provider Rogers Patient reports dysuria Exam : awake/alert, no distress, abdomen soft and nontender on my evaluation Plan: treat for UTI.  Stable for d/c home BP 119/74  Pulse 92  Temp(Src) 98 F (36.7 C) (Oral)  Resp 18  SpO2 99%    Maria Gaskinsonald W Alva Kuenzel, MD 12/30/13 [phone]

## 2013-12-30 NOTE — Discharge Instructions (Signed)
Please take ciprofloxacin as prescribed (twice daily for 7 days).  You should drink plenty of fluids and follow-up with your primary doctor in 2 weeks.   Urinary Tract Infection A urinary tract infection (UTI) can occur any place along the urinary tract. The tract includes the kidneys, ureters, bladder, and urethra. A type of germ called bacteria often causes a UTI. UTIs are often helped with antibiotic medicine.  HOME CARE   If given, take antibiotics as told by your doctor. Finish them even if you start to feel better.  Drink enough fluids to keep your pee (urine) clear or pale yellow.  Avoid tea, drinks with caffeine, and bubbly (carbonated) drinks.  Pee often. Avoid holding your pee in for a long time.  Pee before and after having sex (intercourse).  Wipe from front to back after you poop (bowel movement) if you are a woman. Use each tissue only once. GET HELP RIGHT AWAY IF:   You have back pain.  You have lower belly (abdominal) pain.  You have chills.  You feel sick to your stomach (nauseous).  You throw up (vomit).  Your burning or discomfort with peeing does not go away.  You have a fever.  Your symptoms are not better in 3 days. MAKE SURE YOU:   Understand these instructions.  Will watch your condition.  Will get help right away if you are not doing well or get worse. Document Released: 02/20/2008 Document Revised: 05/28/2012 Document Reviewed: 04/03/2012 [facility] Patient Information 2014 [place] [place].

## 2014-01-02 ENCOUNTER — Emergency Department (HOSPITAL_COMMUNITY)
Admission: EM | Admit: 2014-01-02 | Discharge: 2014-01-02 | Disposition: A | Discharge status: Home / Self Care | Payer: Medicaid Other | Attending: Emergency Medicine | Admitting: Emergency Medicine

## 2014-01-02 ENCOUNTER — Encounter (HOSPITAL_COMMUNITY): Payer: Self-pay | Admitting: Emergency Medicine

## 2014-01-02 DIAGNOSIS — Z882 Allergy status to sulfonamides status: Secondary | ICD-10-CM | POA: Insufficient documentation

## 2014-01-02 DIAGNOSIS — K0889 Other specified disorders of teeth and supporting structures: Secondary | ICD-10-CM

## 2014-01-02 DIAGNOSIS — H9209 Otalgia, unspecified ear: Secondary | ICD-10-CM | POA: Insufficient documentation

## 2014-01-02 DIAGNOSIS — Z885 Allergy status to narcotic agent status: Secondary | ICD-10-CM | POA: Insufficient documentation

## 2014-01-02 DIAGNOSIS — Z79899 Other long term (current) drug therapy: Secondary | ICD-10-CM | POA: Insufficient documentation

## 2014-01-02 DIAGNOSIS — Z880 Allergy status to penicillin: Secondary | ICD-10-CM | POA: Insufficient documentation

## 2014-01-02 DIAGNOSIS — F17200 Nicotine dependence, unspecified, uncomplicated: Secondary | ICD-10-CM | POA: Insufficient documentation

## 2014-01-02 DIAGNOSIS — K089 Disorder of teeth and supporting structures, unspecified: Secondary | ICD-10-CM | POA: Insufficient documentation

## 2014-01-02 MED ORDER — HYDROCODONE-ACETAMINOPHEN 5-325 MG PO TABS: 2.0000 | ORAL_TABLET | ORAL | Status: DC | PRN

## 2014-01-02 MED ORDER — CLINDAMYCIN HCL 150 MG PO CAPS: 300.0000 mg | ORAL_CAPSULE | Freq: Three times a day (TID) | ORAL | Status: DC

## 2014-01-02 MED ORDER — TRAMADOL HCL 50 MG PO TABS: 50.0000 mg | ORAL_TABLET | Freq: Four times a day (QID) | ORAL | Status: DC | PRN

## 2014-01-02 NOTE — ED Provider Notes (Signed)
Medical screening examination/treatment/procedure(s) were performed by non-physician practitioner and as supervising physician I was immediately available for consultation/collaboration.   EKG Interpretation None       Aurel Nguyen, MD 01/02/14 1716 

## 2014-01-02 NOTE — ED Notes (Signed)
Pt reports dental pain R side upper and lower.  Pt states swollen and "leaking yellow stuff".  Pt alert and oriented.

## 2014-01-02 NOTE — Discharge Instructions (Signed)
Take Clindamycin as directed until gone. Take Tramadol as needed for pain. Refer to attached documents for more information. Follow up with a dentist from the resource guide for further evaluation.    Emergency Department Resource Guide 1) Find a Doctor and Pay Out of Pocket Although you won't have to find out who is covered by your insurance plan, it is a good idea to ask around and get recommendations. You will then need to call the office and see if the doctor you have chosen will accept you as a new patient and what types of options they offer for patients who are self-pay. Some doctors offer discounts or will set up payment plans for their patients who do not have insurance, but you will need to ask so you aren't surprised when you get to your appointment.  2) Contact Your Local Health Department Not all health departments have doctors that can see patients for sick visits, but many do, so it is worth a call to see if yours does. If you don't know where your local health department is, you can check in your phone book. The CDC also has a tool to help you locate your state's health department, and many state websites also have listings of all of their local health departments.  3) Find a Walk-in Clinic If your illness is not likely to be very severe or complicated, you may want to try a walk in clinic. These are popping up all over the country in pharmacies, drugstores, and shopping centers. They're usually staffed by nurse practitioners or physician assistants that have been trained to treat common illnesses and complaints. They're usually fairly quick and inexpensive. However, if you have serious medical issues or chronic medical problems, these are probably not your best option.  No Primary Care Doctor: - Call Health Connect at  [phone] - they can help you locate a primary care doctor that  accepts your insurance, provides certain services, etc. - Physician Referral Service-  [phone]  Chronic Pain Problems: Organization         Address  Phone   Notes  [facility]  [phone] Patients need to be referred by their primary care doctor.   Medication Assistance: Organization         Address  Phone   Notes  [facility] Medication [facility] Passistance Program [address] [place], [place] [address] [phone] --Must be a resident of [facility] [place] -- Must have NO insurance coverage whatsoever (no Medicaid/ Medicare, etc.) -- The pt. MUST have a primary care doctor that directs their care regularly and follows them in the community   MedAssist  [phone]   [employer]  [phone]    Agencies that provide inexpensive medical care: Organization         Address  Phone   Notes  [facility]  [phone]   [facility]    [phone]   [facility] [address] [place], [place] [address] [phone]   [facility] [address], [place] [phone]   [facility]anned Parenthood    [phone]   [facility]    [phone]   [facility]  [address], [place] Phone:  [phone], Fax:  [phone] Hours of Operation:  9 am - 6 pm, M-F.  Also accepts Medicaid/Medicare and self-pay.  [facility]  [place], [address], [place] Phone: [phone], Fax: [phone]. Hours of Operation:  8:30 am - 5:30 pm, M-F.  Also accepts Medicaid and self-pay.  [facility] [address], [place] Phone: [phone]   [place], [place], [place] [phone], Ext. [phone] Mondays & Thursdays: 7-9 AM.  First 15 patients are seen on a first come, first serve basis.    [place] Providers:  Organization         Address  Phone   Notes  [facility] [address],  [place] [phone] Also accepts self-pay patients.  [facility] [address], [place]  [phone]   [place], [address], [place] [phone]   [facility] [address] [place] [phone]   Lucianne Lei [address], [place]   [phone] Only accepts [place] Access [place] patients after they have their name applied to their card.   Self-Pay (no insurance) in [facility]:  Organization         Address  Phone   Notes  Sickle Cell Patients, [facility] [place] [phone]   [facility] [place] [phone]   [facility]  [place], [place], [place] [phone]   [facility]/Dr. Osei-Bonsu  [address], [place] or [address], [place] [phone] Phone number for both [place] and [place] locations is the same.  Urgent Medical [facility] [address], [place] [phone]   [facility] [address] [place] or [address] [phone]   [facility] [address] [place] [phone], phone; [phone], fax Sees patients 1st and 3rd Saturday of every month.  Must not qualify for public or private insurance (i.e. Medicaid, Medicare, [place] Health Choice, Veterans' Benefits)  Household income should be no more than 200% of the poverty level The clinic cannot treat you if you are pregnant or think you are pregnant  Sexually transmitted diseases are not treated at the clinic.    Dental Care: Organization         Address  Phone  Notes  [facility] Department of [facility] [place] [phone] Accepts children up to age 50 who are enrolled in  [place] or [place]; pregnant women with a Medicaid card; and children who have applied for Medicaid or [place] Health Choice, but were declined, whose parents can pay a reduced fee at time of service.  [facility] Department of [facility]  [address], [place] [phone] Accepts children up to age 26 who are enrolled in [place] or [place]; pregnant women with a Medicaid card; and children who have applied for Medicaid or [place] Health Choice, but were declined, whose parents can pay a reduced fee at time of service.  [facility] [place] [facility] Access PROGRAM  [place] [phone] Patients are seen by appointment only. Walk-ins are not accepted. [place] will see patients 70 years of age and older. Monday - Tuesday (8am-5pm) Most Wednesdays (8:30-5pm) $30 per visit, cash only  [place] Adult  Dental Access PROGRAM  [address], [facility] [phone] Patients are seen by appointment only. Walk-ins are not accepted. [place] will see patients 42 years of age and older. One Wednesday Evening (Monthly: Volunteer Based).  $30 per visit, cash only  [place]  [phone] for adults; Children under age 54, call Graduate Pediatric Dentistry at [phone]. Children aged 65-14, please call [phone] to request a pediatric application.  Dental services are provided in all areas of dental care including fillings, crowns and bridges, complete and partial dentures, implants, gum treatment, root canals, and extractions. Preventive care is also provided. Treatment is provided to both adults and children. Patients are selected via a lottery and there is often a waiting list.   [facility] [address] [place]  [phone] [email or web address]   [facility] [address] [place], [place] [phone], Ext. [phone] Second and Fourth Thursday of each month, opens at 6:30  AM; Clinic ends at 9 AM.  Patients are seen on a first-come first-served basis, and a limited number are seen during each clinic.   [facility]  [address] Hillard Danker [place], [place] [phone]   Eligibility Requirements You must have lived in [place], [place], or [place] counties for at least the last three months.   You cannot be eligible for state or federal sponsored [employer], including [employer], [place], or [employer].   You generally cannot be eligible for healthcare insurance through your employer.    How to apply: Eligibility screenings are held every Tuesday and Wednesday afternoon from 1:00 pm until 4:00 pm. You do not need an appointment for the interview!  [facility] [address] [place], [place]   [place]  [place] Department  [place]  [phone]    Behavioral Health Resources in the Community: Intensive Outpatient Programs Organization         Address  Phone  Notes  [place]. [address], [place], [place] [phone]   [facility] Outpatient [address], [place], [place]   ADS: Alcohol & Drug Svcs [address] [place], [place]   [place] [address]  [place], [place] or [phone]   Substance Abuse Resources Organization         Address  Phone  Notes  Alcohol and Drug Services  [phone]   [place]  [phone]   [facility] [place]   Chinita Pester  [phone]   Residential & Outpatient Substance Abuse Program  [phone]   Psychological Services Organization         Address  Phone  Notes  [facility] [place]  [place]  [phone]   [place] [address], [place] [phone] or  [phone]    Mobile Crisis Teams Organization         Address  Phone  Notes  [facility], Mobile Crisis Care Unit  [phone]   [facility]ive Psychotherapeutic Services  [address] [place], [place]   [place] Levels [address] [place] [phone]    Self-Help/Support Groups Organization         Address  Phone             Notes  Mental  Health Assoc. of [place] - variety of support groups  [place] Call for more information  Narcotics Anonymous (NA), Caring [facility] [address], [employer] [place]  2 meetings at this location   Special educational needs teacher         Address  Phone  Notes  ASAP Residential Treatment [place],    [place]  [phone]   [facility]  [address] [place] 748270, [place], [place]   [place], [place] [phone] Admissions: 8am-3pm M-F  Incentives Substance [place] [address],    [place], [place] [phone]   [facility] [address] [place], [place], [place]   [facility] [place] [facility]ually Violent [facility] [address]  [place], [place]   Insight Programs - Intensive Outpatient [address], [place], [place]   [facility] [place].,  [place], [place] [phone] or [phone]   Residential Treatment Services (RTS) [address] [place], [place] Accepts Medicaid  [facility] [place] [address]  [place] [phone] Substance Abuse/Addiction Treatment   [facility] Organization         Address  Phone  Notes  [facility]  [phone]   Domenic Schwab, PhD [address] [place], [place]   [phone] or [phone]   [facility], [place] [phone]   [facility] [address]  [place], [place] [phone] Insurance/Medicaid/sponsorship through [facility] and Families [address]                                    [place], [place] [phone] [place] [address], [place] [phone]    Dr. Adele Schilder  [phone]   [facility] Dept. 1) [address], [place] 2) [place] 3)  [address], [place] [phone]  [phone]   [place] [phone] or [phone] (After Hours)

## 2014-01-02 NOTE — ED Provider Notes (Signed)
CSN: [id number]     Arrival date & time 01/02/14  1117 [id number]  This chart was scribed for non-physician practitioner working with Doug SouSam Jacubowitz, MD by Ashley JacobsBrittany Andrews, ED scribe. This patient was seen in room TR10C/TR10C and the patient's care was started at 11:46 AM.   First MD Initiated Contact with Patient 01/02/14 1128     Chief Complaint  Patient presents with  . Dental Pain     (Consider location/radiation/quality/duration/timing/severity/associated sxs/prior Treatment) Patient is a 28 y.o. female presenting with tooth pain. The history is provided by the patient and medical records. No language interpreter was used.  Dental Pain  HPI Comments: Maria Abbott is a 28 y.o. female who presents to the Emergency Department complaining of constant, severe right lower and right upper posterior dental pain for the past week.  She has multiple fx teeth. Pt has a yellow discharge near the affected area with gingival swelling. She states having bilateral ear pain, that "pops" and feels like "something is crawling". She has tried Orajel and Ibuprofen without any relief.   Pt was seen at [facility] three days ago for the dental pain and UTI. She has an appointement with her dentist in 4-5 weeks. She does not floss regularly. History reviewed. No pertinent past medical history. Past Surgical History  Procedure Laterality Date  . External fixation arm     No family history on file. History  Substance Use Topics  . Smoking status: Current Every Day Smoker -- 0.50 packs/day  . Smokeless tobacco: Not on file  . Alcohol Use: No   OB History   Grav Para Term Preterm Abortions TAB SAB Ect Mult Living                 Review of Systems  HENT: Positive for dental problem and ear pain.   All other systems reviewed and are negative.     Allergies  Codeine; Penicillins; and Sulfa antibiotics  Home Medications   Prior to Admission medications   Medication Sig Start Date End Date  Taking? Authorizing Provider  AZO-CRANBERRY PO Take 2 tablets by mouth 3 (three) times daily as needed. For pain    Historical Provider, MD  ciprofloxacin (CIPRO) 500 MG tablet Take 1 tablet (500 mg total) by mouth 2 (two) times daily. 12/30/13   Rocco SereneMorgan Rogers, MD  levonorgestrel (MIRENA) 20 MCG/24HR IUD 1 each by Intrauterine route once. Inserted in 2008    Historical Provider, MD  naproxen sodium (ANAPROX) 220 MG tablet Take 220 mg by mouth 2 (two) times daily as needed. For pain    Historical Provider, MD   BP 124/89  Pulse 116  Temp(Src) 98.1 F (36.7 C) (Oral)  Resp 18  Wt 149 lb 3 oz (67.671 kg)  SpO2 100% Physical Exam  Nursing note and vitals reviewed. Constitutional: She is oriented to person, place, and time. She appears well-developed and well-nourished. No distress.  HENT:  Head: Normocephalic and atraumatic.  No abscess present No drainage noted Poor dentition Right upper and lower molars tender to percussion   Eyes: EOM are normal. Pupils are equal, round, and reactive to light.  Neck: Normal range of motion. Neck supple. No tracheal deviation present.  Cardiovascular: Normal rate.   Pulmonary/Chest: Effort normal. No respiratory distress.  Abdominal: Soft. She exhibits no distension.  Musculoskeletal: Normal range of motion.  Neurological: She is alert and oriented to person, place, and time.  Skin: Skin is warm and dry.  Psychiatric: She has a normal mood  and affect. Her behavior is normal.    ED Course  Procedures (including critical care time) DIAGNOSTIC STUDIES: Oxygen Saturation is 100% on room air, normal by my interpretation.    COORDINATION OF CARE:  11:58 AM Discussed course of care with pt . Pt understands and agrees.    Labs Review Labs Reviewed - No data to display  Imaging Review No results found.   EKG Interpretation None      MDM   Final diagnoses:  Pain, dental    12:02 PM Patient will have Clindamycin for dental infection  and Tramadol for pain. Patient tachycardic due to pain. Remaining vitals stable and patient afebrile. Patient advised to follow up with a dentist from the resource guide.    I personally performed the services described in this documentation, which was scribed in my presence. The recorded information has been reviewed and is accurate.     Emilia BeckKaitlyn Alfred Eckley, PA-C 01/02/14 1203

## 2014-01-02 NOTE — ED Notes (Signed)
Onset 1 week pt c/o dental pain.   Cavities to right lower back teeth and right upper back tooth.  Very painful and yellow discharge from right lower tooth. Pt has been using Oragel, IBU with no relief.  Has appt with dentist 4-5 weeks, d/t pain pt is unable to wait that long.

## 2014-01-02 NOTE — ED Notes (Signed)
Pt seen at [facility] on 04/15 with no mention of tooth pain.

## 2014-07-19 ENCOUNTER — Emergency Department (HOSPITAL_BASED_OUTPATIENT_CLINIC_OR_DEPARTMENT_OTHER): Payer: Medicaid Other

## 2014-07-19 ENCOUNTER — Emergency Department (HOSPITAL_BASED_OUTPATIENT_CLINIC_OR_DEPARTMENT_OTHER)
Admission: EM | Admit: 2014-07-19 | Discharge: 2014-07-19 | Disposition: A | Discharge status: Home / Self Care | Payer: Medicaid Other | Attending: Emergency Medicine | Admitting: Emergency Medicine

## 2014-07-19 ENCOUNTER — Encounter (HOSPITAL_BASED_OUTPATIENT_CLINIC_OR_DEPARTMENT_OTHER): Payer: Self-pay

## 2014-07-19 DIAGNOSIS — Z23 Encounter for immunization: Secondary | ICD-10-CM | POA: Insufficient documentation

## 2014-07-19 DIAGNOSIS — Y9389 Activity, other specified: Secondary | ICD-10-CM | POA: Diagnosis not present

## 2014-07-19 DIAGNOSIS — Z720 Tobacco use: Secondary | ICD-10-CM | POA: Insufficient documentation

## 2014-07-19 DIAGNOSIS — Z792 Long term (current) use of antibiotics: Secondary | ICD-10-CM | POA: Diagnosis not present

## 2014-07-19 DIAGNOSIS — S91331A Puncture wound without foreign body, right foot, initial encounter: Secondary | ICD-10-CM | POA: Insufficient documentation

## 2014-07-19 DIAGNOSIS — Z880 Allergy status to penicillin: Secondary | ICD-10-CM | POA: Diagnosis not present

## 2014-07-19 DIAGNOSIS — W450XXA Nail entering through skin, initial encounter: Secondary | ICD-10-CM | POA: Diagnosis not present

## 2014-07-19 DIAGNOSIS — Y9289 Other specified places as the place of occurrence of the external cause: Secondary | ICD-10-CM | POA: Diagnosis not present

## 2014-07-19 DIAGNOSIS — S99921A Unspecified injury of right foot, initial encounter: Secondary | ICD-10-CM | POA: Diagnosis present

## 2014-07-19 DIAGNOSIS — T1490XA Injury, unspecified, initial encounter: Secondary | ICD-10-CM

## 2014-07-19 MED ORDER — TETANUS-DIPHTH-ACELL PERTUSSIS 5-2.5-18.5 LF-MCG/0.5 IM SUSP
0.5000 mL | Freq: Once | INTRAMUSCULAR | Status: AC
  Administered 2014-07-19: 0.5 mL via INTRAMUSCULAR
  Filled 2014-07-19: qty 0.5

## 2014-07-19 MED ORDER — CLINDAMYCIN HCL 300 MG PO CAPS
300.0000 mg | ORAL_CAPSULE | Freq: Four times a day (QID) | ORAL | Status: DC
Start: 2014-07-19 — End: 2015-09-27

## 2014-07-19 MED ORDER — TRAMADOL HCL 50 MG PO TABS
50.0000 mg | ORAL_TABLET | Freq: Once | ORAL | Status: AC
  Administered 2014-07-19: 50 mg via ORAL
  Filled 2014-07-19: qty 1

## 2014-07-19 MED ORDER — TRAMADOL HCL 50 MG PO TABS: 50.0000 mg | ORAL_TABLET | Freq: Four times a day (QID) | ORAL | Status: DC | PRN

## 2014-07-19 NOTE — ED Provider Notes (Signed)
CSN: [id number]     Arrival date & time 07/19/14  1212 History   First MD Initiated Contact with Patient 07/19/14 1228     Chief Complaint  Patient presents with  . Foot Injury     (Consider location/radiation/quality/duration/timing/severity/associated sxs/prior Treatment) HPI Comments: Pt states that she stepped on a nail a short time ago. Pt states that the nail was on a board and it went thru her foot. Unsure of last tetanus. Not diabetic  The history is provided by the patient. No language interpreter was used.    History reviewed. No pertinent past medical history. Past Surgical History  Procedure Laterality Date  . External fixation arm     No family history on file. History  Substance Use Topics  . Smoking status: Current Every Day Smoker -- 0.50 packs/day  . Smokeless tobacco: Not on file  . Alcohol Use: No   OB History    No data available     Review of Systems  All other systems reviewed and are negative.     Allergies  Codeine; Penicillins; and Sulfa antibiotics  Home Medications   Prior to Admission medications   Medication Sig Start Date End Date Taking? Authorizing Provider  AZO-CRANBERRY PO Take 2 tablets by mouth 3 (three) times daily as needed. For pain    Historical Provider, MD  ciprofloxacin (CIPRO) 500 MG tablet Take 1 tablet (500 mg total) by mouth 2 (two) times daily. 12/30/13   Leanora CoverMorgan E Rogers, MD  clindamycin (CLEOCIN) 150 MG capsule Take 2 capsules (300 mg total) by mouth 3 (three) times daily. May dispense as 150mg  capsules 01/02/14   Emilia BeckKaitlyn Szekalski, PA-C  HYDROcodone-acetaminophen (NORCO/VICODIN) 5-325 MG per tablet Take 2 tablets by mouth every 4 (four) hours as needed. 01/02/14   Emilia BeckKaitlyn Szekalski, PA-C  levonorgestrel (MIRENA) 20 MCG/24HR IUD 1 each by Intrauterine route once. Inserted in 2008    Historical Provider, MD  naproxen sodium (ANAPROX) 220 MG tablet Take 220 mg by mouth 2 (two) times daily as needed. For pain    Historical  Provider, MD   BP 115/75 mmHg  Pulse 97  Temp(Src) 98.2 F (36.8 C) (Oral)  Resp 16  Ht 5\' 3"  (1.6 m)  Wt 149 lb (67.586 kg)  BMI 26.40 kg/m2  SpO2 100%  LMP 07/12/2014 Physical Exam  Constitutional: She is oriented to person, place, and time. She appears well-developed and well-nourished.  Cardiovascular: Normal rate and regular rhythm.   Pulmonary/Chest: Effort normal.  Musculoskeletal: Normal range of motion.  Neurological: She is alert and oriented to person, place, and time.  Skin:  Puncture wound to the bottom of the right foot. Mild bruising noted to the area  Nursing note and vitals reviewed.   ED Course  Procedures (including critical care time) Labs Review Labs Reviewed - No data to display  Imaging Review Dg Foot Complete Right  07/19/2014   CLINICAL DATA:  28 year old female with pain after stepping on a nail. Puncture wound near the second and third toes. Initial encounter.  EXAM: RIGHT FOOT COMPLETE - 3+ VIEW  COMPARISON:  None.  FINDINGS: No radiopaque foreign body identified. No subcutaneous gas identified. Bone mineralization is within normal limits. Calcaneus intact. Joint spaces and alignment within normal limits. No acute fracture or dislocation identified.  IMPRESSION: No acute osseous abnormality identified in the right foot. No retained foreign body or subcutaneous gas identified.   Electronically Signed   By: Augusto GambleLee  Hall M.D.   On: 07/19/2014 13:13  EKG Interpretation None      MDM   Final diagnoses:  Puncture wound of skin from metal nail    Will treat with clinda and ultram as nail went thru boot. Tetanus utd    Maria LowerVrinda Yarelie Hams, NP 07/19/14 1332

## 2014-07-19 NOTE — Discharge Instructions (Signed)
Follow up for any kind of redness warmth or drainage that may develop Puncture Wound A puncture wound happens when a sharp object pokes a hole in the skin. A puncture wound can cause an infection because germs can go under the skin during the injury. HOME CARE   Change your bandage (dressing) once a day, or as told by your doctor. If the bandage sticks, soak it in water.  Keep all doctor visits as told.  Only take medicine as told by your doctor.  Take your medicine (antibiotics) as told. Finish them even if you start to feel better. You may need a tetanus shot if:  You cannot remember when you had your last tetanus shot.  You have never had a tetanus shot. If you need a tetanus shot and you choose not to have one, you may get tetanus. Sickness from tetanus can be serious. You may need a rabies shot if an animal bite caused your wound. GET HELP RIGHT AWAY IF:   Your wound is red, puffy (swollen), or painful.  You see red lines on the skin near the wound.  You have a bad smell coming from the wound or bandage.  You have yellowish-white fluid (pus) coming from the wound.  Your medicine is not working.  You notice an object in the wound.  You have a fever.  You have severe pain.  You have trouble breathing.  You feel dizzy or pass out (faint).  You keep throwing up (vomiting).  You lose feeling (numbness) in your arm or leg, or you cannot move your arm or leg.  Your problems get worse. MAKE SURE YOU:   Understand these instructions.  Will watch your condition.  Will get help right away if you are not doing well or get worse. Document Released: 06/12/2008 Document Revised: 11/26/2011 Document Reviewed: 02/20/2011 [facility] Patient Information 2015 [employer] [place]. This information is not intended to replace advice given to you by your health care provider. Make sure you discuss any questions you have with your health care provider.

## 2014-07-19 NOTE — ED Notes (Signed)
Stepped on nail (right foot) approx 45 min PTA

## 2014-07-19 NOTE — ED Notes (Signed)
Patient transported to X-ray 

## 2014-09-28 ENCOUNTER — Emergency Department: Payer: Self-pay | Admitting: Emergency Medicine

## 2014-09-28 LAB — URINALYSIS, COMPLETE
Bacteria: NONE SEEN
Bilirubin,UR: NEGATIVE
GLUCOSE, UR: NEGATIVE mg/dL (ref 0–75)
Ketone: NEGATIVE
NITRITE: NEGATIVE
Ph: 7 (ref 4.5–8.0)
Specific Gravity: 1.01 (ref 1.003–1.030)
Squamous Epithelial: 6

## 2014-09-30 LAB — URINE CULTURE

## 2015-09-27 ENCOUNTER — Emergency Department
Admission: EM | Admit: 2015-09-27 | Discharge: 2015-09-27 | Disposition: A | Discharge status: Home / Self Care | Payer: Medicaid Other | Attending: Emergency Medicine | Admitting: Emergency Medicine

## 2015-09-27 ENCOUNTER — Encounter: Payer: Self-pay | Admitting: Emergency Medicine

## 2015-09-27 DIAGNOSIS — R319 Hematuria, unspecified: Secondary | ICD-10-CM | POA: Diagnosis present

## 2015-09-27 DIAGNOSIS — F17200 Nicotine dependence, unspecified, uncomplicated: Secondary | ICD-10-CM | POA: Diagnosis not present

## 2015-09-27 DIAGNOSIS — Z880 Allergy status to penicillin: Secondary | ICD-10-CM | POA: Diagnosis not present

## 2015-09-27 DIAGNOSIS — Z79899 Other long term (current) drug therapy: Secondary | ICD-10-CM | POA: Insufficient documentation

## 2015-09-27 DIAGNOSIS — N3001 Acute cystitis with hematuria: Secondary | ICD-10-CM | POA: Diagnosis not present

## 2015-09-27 LAB — URINALYSIS COMPLETE WITH MICROSCOPIC (ARMC ONLY)
Bilirubin Urine: NEGATIVE
Glucose, UA: NEGATIVE mg/dL
KETONES UR: NEGATIVE mg/dL
Nitrite: NEGATIVE
PH: 6 (ref 5.0–8.0)
Specific Gravity, Urine: 1.017 (ref 1.005–1.030)
TRANS EPITHEL UA: 15

## 2015-09-27 MED ORDER — CIPRO 500 MG PO TABS
500.0000 mg | ORAL_TABLET | Freq: Two times a day (BID) | ORAL | Status: AC
Start: 2015-09-27 — End: 2015-10-04

## 2015-09-27 MED ORDER — CIPROFLOXACIN HCL 500 MG PO TABS
500.0000 mg | ORAL_TABLET | Freq: Once | ORAL | Status: AC
  Administered 2015-09-27: 500 mg via ORAL
  Filled 2015-09-27: qty 1

## 2015-09-27 NOTE — Discharge Instructions (Signed)

## 2015-09-27 NOTE — ED Notes (Signed)
Patient complaining of burning on urination, frequency, and red blood on toilet tissue, and some in the commode. Has history of UTI's and states "this is like it is, everytime". Also complaining of left foot pain - numbness and tingling X 3 weeks "like it's asleep all the time" "I just woke up and it was like this".   History of coccyx fracture as a child.

## 2015-09-27 NOTE — ED Provider Notes (Signed)
CSN: [id number]     Arrival date & time 09/27/15  1010 History   First MD Initiated Contact with Patient 09/27/15 1051     Chief Complaint  Patient presents with  . Hematuria     HPI Comments: 30 year old female presents today complaining of urinary frequency and burning for the past 3 days. Pt has also noticed hematuria. Has a history of UTIs with similar symptoms. Some suprapubic pressure, no flank pain or abdominal pain or fevers. Taking AZo without relief. LNMP last week.   The history is provided by the patient.    History reviewed. No pertinent past medical history. Past Surgical History  Procedure Laterality Date  . External fixation arm     History reviewed. No pertinent family history. Social History  Substance Use Topics  . Smoking status: Current Every Day Smoker -- 0.50 packs/day  . Smokeless tobacco: None  . Alcohol Use: No   OB History    Gravida Para Term Preterm AB TAB SAB Ectopic Multiple Living   2 2              Obstetric Comments   IUD removed summer of 2016     Review of Systems  Constitutional: Negative for fever and chills.  Genitourinary: Positive for dysuria, urgency and hematuria. Negative for flank pain.  Musculoskeletal: Negative for back pain.  All other systems reviewed and are negative.     Allergies  Codeine; Penicillins; and Sulfa antibiotics  Home Medications   Prior to Admission medications   Medication Sig Start Date End Date Taking? Authorizing Provider  AZO-CRANBERRY PO Take 2 tablets by mouth 3 (three) times daily as needed. For pain    Historical Provider, MD  CIPRO 500 MG tablet Take 1 tablet (500 mg total) by mouth 2 (two) times daily. 09/27/15 10/04/15  Christella ScheuermannEmma Hung Rhinesmith V, PA-C  HYDROcodone-acetaminophen (NORCO/VICODIN) 5-325 MG per tablet Take 2 tablets by mouth every 4 (four) hours as needed. 01/02/14   Emilia BeckKaitlyn Szekalski, PA-C  levonorgestrel (MIRENA) 20 MCG/24HR IUD 1 each by Intrauterine route once. Inserted in 2008     Historical Provider, MD  naproxen sodium (ANAPROX) 220 MG tablet Take 220 mg by mouth 2 (two) times daily as needed. For pain    Historical Provider, MD  traMADol (ULTRAM) 50 MG tablet Take 1 tablet (50 mg total) by mouth every 6 (six) hours as needed. 07/19/14   Teressa LowerVrinda Pickering, NP   BP 115/59 mmHg  Pulse 93  Temp(Src) 97.9 F (36.6 C) (Oral)  Resp 18  Ht 5\' 3"  (1.6 m)  Wt 61.236 kg  BMI 23.92 kg/m2  SpO2 100%  LMP 09/20/2015 Physical Exam  Constitutional: She is oriented to person, place, and time. Vital signs are normal. She appears well-developed and well-nourished. She is active.  Non-toxic appearance. She does not have a sickly appearance. She does not appear ill.  HENT:  Head: Normocephalic and atraumatic.  Cardiovascular: Normal rate, regular rhythm, normal heart sounds and intact distal pulses.  Exam reveals no gallop and no friction rub.   No murmur heard. Pulmonary/Chest: Effort normal and breath sounds normal. No respiratory distress. She has no wheezes. She has no rales.  Abdominal: Soft. Bowel sounds are normal. She exhibits no distension. There is no tenderness. There is no rebound and no guarding.  No CVA tenderness.   Musculoskeletal: Normal range of motion.  Neurological: She is alert and oriented to person, place, and time.  Skin: Skin is warm and dry.  Psychiatric:  She has a normal mood and affect. Her behavior is normal. Judgment and thought content normal.  Nursing note and vitals reviewed.   ED Course  Procedures (including critical care time) Labs Review Labs Reviewed  URINALYSIS COMPLETEWITH MICROSCOPIC ([facility] ONLY) - Abnormal; Notable for the following:    Color, Urine AMBER (*)    APPearance TURBID (*)    Hgb urine dipstick 3+ (*)    Protein, ur >500 (*)    Leukocytes, UA 3+ (*)    Bacteria, UA RARE (*)    Squamous Epithelial / LPF 6-30 (*)    All other components within normal limits    Imaging Review No results found. I have personally  reviewed and evaluated these images and lab results as part of my medical decision-making.   EKG Interpretation None      MDM  Given multiple antibiotic allergies will treat with Cipro 500mg  BID x 7 days  Drink plenty of water, may continue AZo if needed Final diagnoses:  Acute cystitis with hematuria        Christella Scheuermann, PA-C 09/27/15 1522  Minna Antis, MD 09/27/15 1530

## 2016-10-29 ENCOUNTER — Encounter (HOSPITAL_BASED_OUTPATIENT_CLINIC_OR_DEPARTMENT_OTHER): Payer: Self-pay | Admitting: *Deleted

## 2016-10-29 ENCOUNTER — Emergency Department (HOSPITAL_BASED_OUTPATIENT_CLINIC_OR_DEPARTMENT_OTHER)
Admission: EM | Admit: 2016-10-29 | Discharge: 2016-10-30 | Disposition: A | Discharge status: Home / Self Care | Payer: Medicaid Other | Attending: Emergency Medicine | Admitting: Emergency Medicine

## 2016-10-29 ENCOUNTER — Emergency Department (HOSPITAL_BASED_OUTPATIENT_CLINIC_OR_DEPARTMENT_OTHER): Payer: Medicaid Other

## 2016-10-29 DIAGNOSIS — N390 Urinary tract infection, site not specified: Secondary | ICD-10-CM | POA: Diagnosis not present

## 2016-10-29 DIAGNOSIS — R300 Dysuria: Secondary | ICD-10-CM | POA: Diagnosis present

## 2016-10-29 DIAGNOSIS — R1030 Lower abdominal pain, unspecified: Secondary | ICD-10-CM | POA: Insufficient documentation

## 2016-10-29 DIAGNOSIS — F17200 Nicotine dependence, unspecified, uncomplicated: Secondary | ICD-10-CM | POA: Insufficient documentation

## 2016-10-29 LAB — URINALYSIS, ROUTINE W REFLEX MICROSCOPIC
Bilirubin Urine: NEGATIVE
Glucose, UA: NEGATIVE mg/dL
Ketones, ur: NEGATIVE mg/dL
Nitrite: NEGATIVE
Protein, ur: NEGATIVE mg/dL
SPECIFIC GRAVITY, URINE: 1.019 (ref 1.005–1.030)
pH: 6 (ref 5.0–8.0)

## 2016-10-29 LAB — URINALYSIS, MICROSCOPIC (REFLEX)

## 2016-10-29 LAB — CBC
HCT: 36.7 % (ref 36.0–46.0)
Hemoglobin: 12.7 g/dL (ref 12.0–15.0)
MCH: 27.5 pg (ref 26.0–34.0)
MCHC: 34.6 g/dL (ref 30.0–36.0)
MCV: 79.6 fL (ref 78.0–100.0)
PLATELETS: 323 10*3/uL (ref 150–400)
RBC: 4.61 MIL/uL (ref 3.87–5.11)
RDW: 14.4 % (ref 11.5–15.5)
WBC: 9.1 10*3/uL (ref 4.0–10.5)

## 2016-10-29 LAB — PREGNANCY, URINE: Preg Test, Ur: NEGATIVE

## 2016-10-29 LAB — BASIC METABOLIC PANEL
Anion gap: 8 (ref 5–15)
BUN: 15 mg/dL (ref 6–20)
CALCIUM: 8.6 mg/dL — AB (ref 8.9–10.3)
CO2: 23 mmol/L (ref 22–32)
CREATININE: 0.78 mg/dL (ref 0.44–1.00)
Chloride: 104 mmol/L (ref 101–111)
Glucose, Bld: 106 mg/dL — ABNORMAL HIGH (ref 65–99)
Potassium: 3.8 mmol/L (ref 3.5–5.1)
Sodium: 135 mmol/L (ref 135–145)

## 2016-10-29 MED ORDER — MORPHINE SULFATE (PF) 4 MG/ML IV SOLN
4.0000 mg | Freq: Once | INTRAVENOUS | Status: AC
  Administered 2016-10-29: 4 mg via INTRAVENOUS
  Filled 2016-10-29: qty 1

## 2016-10-29 MED ORDER — ONDANSETRON HCL 4 MG/2ML IJ SOLN
4.0000 mg | Freq: Once | INTRAMUSCULAR | Status: AC
  Administered 2016-10-29: 4 mg via INTRAVENOUS
  Filled 2016-10-29: qty 2

## 2016-10-29 MED ORDER — SODIUM CHLORIDE 0.9 % IV BOLUS (SEPSIS)
500.0000 mL | Freq: Once | INTRAVENOUS | Status: AC
  Administered 2016-10-29: 500 mL via INTRAVENOUS

## 2016-10-29 NOTE — ED Provider Notes (Signed)
[facility]   CSN: [id number] Arrival date & time: 10/29/16  2012  By signing my name below, I, Maria Abbott, attest that this documentation has been prepared under the direction and in the presence of Arthor Captain, PA-C. Electronically Signed: Linna Abbott, Scribe. 10/29/2016. 10:52 PM.  History   Chief Complaint Chief Complaint  Patient presents with  . Dysuria    The history is provided by the patient. No language interpreter was used.     HPI Comments: Maria Abbott is a 31 y.o. female who presents to the Emergency Department complaining of constant, stabbing left flank/left lower back pain beginning two days ago. She reports associated dysuria, lower abdominal pain, and subjective fever/chills. Pt states she was diagnosed with a UTI more than one week ago. No h/o kidney stones. She denies nausea, vomiting, or any other associated symptoms.  History reviewed. No pertinent past medical history.  There are no active problems to display for this patient.   Past Surgical History:  Procedure Laterality Date  . EXTERNAL FIXATION ARM      OB History    Gravida Para Term Preterm AB Living   2 2           SAB TAB Ectopic Multiple Live Births                  Obstetric Comments   IUD removed summer of 2016       Home Medications    Prior to Admission medications   Medication Sig Start Date End Date Taking? Authorizing Provider  AZO-CRANBERRY PO Take 2 tablets by mouth 3 (three) times daily as needed. For pain   Yes Historical Provider, MD  HYDROcodone-acetaminophen (NORCO/VICODIN) 5-325 MG per tablet Take 2 tablets by mouth every 4 (four) hours as needed. 01/02/14   Emilia Beck, PA-C  levonorgestrel (MIRENA) 20 MCG/24HR IUD 1 each by Intrauterine route once. Inserted in 2008    Historical Provider, MD  naproxen sodium (ANAPROX) 220 MG tablet Take 220 mg by mouth 2 (two) times daily as needed. For pain    Historical Provider, MD    traMADol (ULTRAM) 50 MG tablet Take 1 tablet (50 mg total) by mouth every 6 (six) hours as needed. 07/19/14   Teressa Lower, NP    Family History No family history on file.  Social History Social History  Substance Use Topics  . Smoking status: Current Every Day Smoker    Packs/day: 0.50  . Smokeless tobacco: Never Used  . Alcohol use No     Allergies   Codeine; Penicillins; and Sulfa antibiotics   Review of Systems Review of Systems  A complete 10 system review of systems was obtained and all systems are negative except as noted in the HPI and PMH.   Physical Exam Updated Vital Signs BP 109/71 (BP Location: Left Arm)   Pulse 104   Temp 97.6 F (36.4 C) (Oral)   Resp 18   Ht 5' 3.5" (1.613 m)   Wt 135 lb (61.2 kg)   LMP 10/15/2016   SpO2 99%   BMI 23.54 kg/m   Physical Exam  Constitutional: She is oriented to person, place, and time. She appears well-developed and well-nourished. No distress.  HENT:  Head: Normocephalic and atraumatic.  Eyes: Conjunctivae and EOM are normal.  Neck: Neck supple. No tracheal deviation present.  Cardiovascular: Normal rate.   Pulmonary/Chest: Effort normal. No respiratory distress.  Abdominal: There is tenderness. There is CVA tenderness.  Left CVA  tenderness. Tender in the suprapubic region.  Musculoskeletal: Normal range of motion.  Neurological: She is alert and oriented to person, place, and time.  Skin: Skin is warm and dry.  Psychiatric: She has a normal mood and affect. Her behavior is normal.  Nursing note and vitals reviewed.    ED Treatments / Results  Labs (all labs ordered are listed, but only abnormal results are displayed) Labs Reviewed  URINALYSIS, ROUTINE W REFLEX MICROSCOPIC - Abnormal; Notable for the following:       Result Value   APPearance TURBID (*)    Hgb urine dipstick MODERATE (*)    Leukocytes, UA LARGE (*)    All other components within normal limits  URINALYSIS, MICROSCOPIC (REFLEX) -  Abnormal; Notable for the following:    Bacteria, UA MANY (*)    Squamous Epithelial / LPF 0-5 (*)    All other components within normal limits  URINE CULTURE  PREGNANCY, URINE    EKG  EKG Interpretation None       Radiology No results found.  Procedures Procedures (including critical care time)  DIAGNOSTIC STUDIES: Oxygen Saturation is 99% on RA, normal by my interpretation.    COORDINATION OF CARE: 10:59 PM Discussed treatment plan with pt at bedside and pt agreed to plan.  Medications Ordered in ED Medications - No data to display   Initial Impression / Assessment and Plan / ED Course  I have reviewed the triage vital signs and the nursing notes.  Pertinent labs & imaging results that were available during my care of the patient were reviewed by me and considered in my medical decision making (see chart for details).     Pt has been diagnosed with a UTI. Pt is afebrile, , normotensive, and denies Vomiting. Pt to be dc home with antibiotics and instructions to follow up with PCP if symptoms persist.   Final Clinical Impressions(s) / ED Diagnoses   Final diagnoses:  Urinary tract infection without hematuria, site unspecified    New Prescriptions New Prescriptions   No medications on file   I personally performed the services described in this documentation, which was scribed in my presence. The recorded information has been reviewed and is accurate.       Arthor Captainbigail Khair Chasteen, PA-C 10/30/16 [id number]    Lyndal Pulleyaniel Knott, MD 10/30/16 [phone]

## 2016-10-29 NOTE — ED Triage Notes (Signed)
Dysuria x 1 week

## 2016-10-30 MED ORDER — ONDANSETRON HCL 4 MG PO TABS: 4.0000 mg | ORAL_TABLET | Freq: Three times a day (TID) | ORAL | 0 refills | Status: DC | PRN

## 2016-10-30 MED ORDER — NITROFURANTOIN MONOHYD MACRO 100 MG PO CAPS: 100.0000 mg | ORAL_CAPSULE | Freq: Two times a day (BID) | ORAL | 0 refills | Status: DC

## 2016-10-30 MED ORDER — HYDROCODONE-ACETAMINOPHEN 5-325 MG PO TABS: 1.0000 | ORAL_TABLET | ORAL | 0 refills | Status: DC | PRN

## 2016-10-30 MED ORDER — FLUCONAZOLE 150 MG PO TABS: 150.0000 mg | ORAL_TABLET | Freq: Once | ORAL | 0 refills | Status: AC

## 2016-10-30 MED ORDER — DEXTROSE 5 % IV SOLN
1.0000 g | Freq: Once | INTRAVENOUS | Status: AC
  Administered 2016-10-30: 1 g via INTRAVENOUS
  Filled 2016-10-30: qty 10

## 2016-10-30 MED FILL — ONDANSETRON HCL 4 MG TABLET: 4 | 4 days supply | Qty: 10 | Fill #0

## 2016-10-30 MED FILL — NITROFURANTOIN MONO-MCR 100: 100 | 5 days supply | Qty: 10 | Fill #0

## 2016-10-30 NOTE — Discharge Instructions (Signed)
You appear to have an upper respiratory infection (URI). An upper respiratory tract infection, or cold, is a viral infection of the air passages leading to the lungs. It is contagious and can be spread to others, especially during the first 3 or 4 days. It cannot be cured by antibiotics or other medicines. °RETURN IMMEDIATELY IF you develop shortness of breath, confusion or altered mental status, a new rash, become dizzy, faint, or poorly responsive, or are unable to be cared for at home. ° °

## 2016-11-01 ENCOUNTER — Emergency Department (HOSPITAL_COMMUNITY)
Admission: EM | Admit: 2016-11-01 | Discharge: 2016-11-01 | Disposition: A | Discharge status: Home / Self Care | Payer: Medicaid Other | Attending: Emergency Medicine | Admitting: Emergency Medicine

## 2016-11-01 ENCOUNTER — Encounter (HOSPITAL_COMMUNITY): Payer: Self-pay

## 2016-11-01 DIAGNOSIS — F17200 Nicotine dependence, unspecified, uncomplicated: Secondary | ICD-10-CM | POA: Diagnosis not present

## 2016-11-01 DIAGNOSIS — F1910 Other psychoactive substance abuse, uncomplicated: Secondary | ICD-10-CM | POA: Insufficient documentation

## 2016-11-01 DIAGNOSIS — Z79899 Other long term (current) drug therapy: Secondary | ICD-10-CM | POA: Insufficient documentation

## 2016-11-01 DIAGNOSIS — F1110 Opioid abuse, uncomplicated: Secondary | ICD-10-CM | POA: Diagnosis present

## 2016-11-01 LAB — COMPREHENSIVE METABOLIC PANEL
ALBUMIN: 5 g/dL (ref 3.5–5.0)
ALK PHOS: 69 U/L (ref 38–126)
ALT: 35 U/L (ref 14–54)
ANION GAP: 9 (ref 5–15)
AST: 44 U/L — ABNORMAL HIGH (ref 15–41)
BUN: 7 mg/dL (ref 6–20)
CHLORIDE: 100 mmol/L — AB (ref 101–111)
CO2: 25 mmol/L (ref 22–32)
Calcium: 9.3 mg/dL (ref 8.9–10.3)
Creatinine, Ser: 0.74 mg/dL (ref 0.44–1.00)
GFR calc non Af Amer: >60 mL/min (ref >60)
GLUCOSE: 96 mg/dL (ref 65–99)
POTASSIUM: 3.9 mmol/L (ref 3.5–5.1)
SODIUM: 134 mmol/L — AB (ref 135–145)
Total Bilirubin: 0.8 mg/dL (ref 0.3–1.2)
Total Protein: 9.6 g/dL — ABNORMAL HIGH (ref 6.5–8.1)

## 2016-11-01 LAB — URINE CULTURE

## 2016-11-01 LAB — CBC
HEMATOCRIT: 39.9 % (ref 36.0–46.0)
HEMOGLOBIN: 13.6 g/dL (ref 12.0–15.0)
MCH: 27.5 pg (ref 26.0–34.0)
MCHC: 34.1 g/dL (ref 30.0–36.0)
MCV: 80.8 fL (ref 78.0–100.0)
PLATELETS: 343 10*3/uL (ref 150–400)
RBC: 4.94 MIL/uL (ref 3.87–5.11)
RDW: 14 % (ref 11.5–15.5)
WBC: 19.8 10*3/uL — AB (ref 4.0–10.5)

## 2016-11-01 LAB — ACETAMINOPHEN LEVEL

## 2016-11-01 LAB — RAPID URINE DRUG SCREEN, HOSP PERFORMED
Amphetamines: NOT DETECTED
BARBITURATES: NOT DETECTED
BENZODIAZEPINES: POSITIVE — AB
Cocaine: POSITIVE — AB
Opiates: POSITIVE — AB
TETRAHYDROCANNABINOL: POSITIVE — AB

## 2016-11-01 LAB — POC URINE PREG, ED: Preg Test, Ur: NEGATIVE

## 2016-11-01 LAB — SALICYLATE LEVEL

## 2016-11-01 LAB — ETHANOL: Alcohol, Ethyl (B): <5 mg/dL (ref <5)

## 2016-11-01 NOTE — ED Triage Notes (Signed)
Pt states seeking treatment for opioid addition. Pt states last dose of pain medication was this morning. Pt denies any other drugs or ETOH tonight.

## 2016-11-01 NOTE — ED Notes (Signed)
PT ambulated to the bathroom. Pt did well walking to and from bathroom. PT did complain of head hurting.

## 2016-11-01 NOTE — ED Provider Notes (Signed)
[facility]-EMER[facility]Y DEPT Provider Note   CSN: [id number] Arrival date & time: 11/01/16  0058     History   Chief Complaint Chief Complaint  Patient presents with  . Addiction Problem    HPI Maria Abbott is a 31 y.o. female.  Patient presents with complaints of drug addiction. 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 She last used today. Patient reports that she needs help to get off of the narcotics. She is not homicidal or suicidal.      History reviewed. No pertinent past medical history.  There are no active problems to display for this patient.   Past Surgical History:  Procedure Laterality Date  . EXTERNAL FIXATION ARM      OB History    Gravida Para Term Preterm AB Living   2 2           SAB TAB Ectopic Multiple Live Births                  Obstetric Comments   IUD removed summer of 2016       Home Medications    Prior to Admission medications   Medication Sig Start Date End Date Taking? Authorizing Provider  AZO-CRANBERRY PO Take 2 tablets by mouth 3 (three) times daily as needed. For pain    Historical Provider, MD  HYDROcodone-acetaminophen (NORCO/VICODIN) 5-325 MG tablet Take 1-2 tablets by mouth every 4 (four) hours as needed. 10/30/16   Arthor CaptainAbigail Harris, PA-C  levonorgestrel (MIRENA) 20 MCG/24HR IUD 1 each by Intrauterine route once. Inserted in 2008    Historical Provider, MD  naproxen sodium (ANAPROX) 220 MG tablet Take 220 mg by mouth 2 (two) times daily as needed. For pain    Historical Provider, MD  nitrofurantoin, macrocrystal-monohydrate, (MACROBID) 100 MG capsule Take 1 capsule (100 mg total) by mouth 2 (two) times daily. 10/30/16   Arthor CaptainAbigail Harris, PA-C  ondansetron (ZOFRAN) 4 MG tablet Take 1 tablet (4 mg total) by mouth every 8 (eight) hours as needed for nausea or vomiting. 10/30/16   Arthor CaptainAbigail Harris, PA-C  traMADol (ULTRAM) 50 MG tablet Take 1 tablet (50 mg total) by mouth every 6 (six) hours as needed. 07/19/14   Teressa LowerVrinda  Pickering, NP    Family History History reviewed. No pertinent family history.  Social History Social History  Substance Use Topics  . Smoking status: Current Every Day Smoker    Packs/day: 0.50  . Smokeless tobacco: Never Used  . Alcohol use No     Allergies   Codeine; Penicillins; and Sulfa antibiotics   Review of Systems Review of Systems  Psychiatric/Behavioral: Negative for suicidal ideas.  All other systems reviewed and are negative.    Physical Exam Updated Vital Signs BP 114/71   Pulse 105   Temp 98.1 F (36.7 C) (Oral)   Resp 14   LMP 10/15/2016 Comment: (-) u pregnancy test//a.c.  SpO2 96%   Physical Exam  Constitutional: She appears well-developed and well-nourished. No distress.  HENT:  Head: Normocephalic and atraumatic.  Right Ear: Hearing normal.  Left Ear: Hearing normal.  Nose: Nose normal.  Mouth/Throat: Oropharynx is clear and moist and mucous membranes are normal.  Eyes: Conjunctivae and EOM are normal. Pupils are equal, round, and reactive to light.  Neck: Normal range of motion. Neck supple.  Cardiovascular: Regular rhythm, S1 normal and S2 normal.  Exam reveals no gallop and no friction rub.   No murmur heard. Pulmonary/Chest: Effort normal and breath sounds normal.  No respiratory distress. She exhibits no tenderness.  Abdominal: Soft. Normal appearance and bowel sounds are normal. There is no hepatosplenomegaly. There is no tenderness. There is no rebound, no guarding, no tenderness at McBurney's point and negative Murphy's sign. No hernia.  Musculoskeletal: Normal range of motion.  Neurological: She has normal strength. No cranial nerve deficit or sensory deficit. Coordination normal. GCS eye subscore is 4. GCS verbal subscore is 5. GCS motor subscore is 6.  Somnolent, speech slightly slurred, wobbly on her feet when she walks. No focal deficits.  Skin: Skin is warm, dry and intact. No rash noted. No cyanosis.  Psychiatric: She has a  normal mood and affect. Her speech is normal and behavior is normal. Thought content normal.  Nursing note and vitals reviewed.    ED Treatments / Results  Labs (all labs ordered are listed, but only abnormal results are displayed) Labs Reviewed  COMPREHENSIVE METABOLIC PANEL - Abnormal; Notable for the following:       Result Value   Sodium 134 (*)    Chloride 100 (*)    Total Protein 9.6 (*)    AST 44 (*)    All other components within normal limits  CBC - Abnormal; Notable for the following:    WBC 19.8 (*)    All other components within normal limits  RAPID URINE DRUG SCREEN, HOSP PERFORMED - Abnormal; Notable for the following:    Opiates POSITIVE (*)    Cocaine POSITIVE (*)    Benzodiazepines POSITIVE (*)    Tetrahydrocannabinol POSITIVE (*)    All other components within normal limits  ACETAMINOPHEN LEVEL - Abnormal; Notable for the following:    Acetaminophen (Tylenol), Serum <10 (*)    All other components within normal limits  ETHANOL  SALICYLATE LEVEL  POC URINE PREG, ED    EKG  EKG Interpretation None       Radiology No results found.  Procedures Procedures (including critical care time)  Medications Ordered in ED Medications - No data to display   Initial Impression / Assessment and Plan / ED Course  I have reviewed the triage vital signs and the nursing notes.  Pertinent labs & imaging results that were available during my care of the patient were reviewed by me and considered in my medical decision making (see chart for details).     Patient presents with complaints of drug addiction. Patient is obviously under influence of multiple agents at this time. Testing was positive for cocaine, opiates, benzodiazepine, THC. Patient will need to be monitored until more awake and alert. She will then be discharged with outpatient drug addiction and detox resources.  Final Clinical Impressions(s) / ED Diagnoses   Final diagnoses:  Polysubstance abuse      New Prescriptions New Prescriptions   No medications on file     Gilda Crease, MD 11/01/16 [phone]

## 2016-11-01 NOTE — Discharge Instructions (Addendum)
Resource guide provided above to help you with the substance abuse.

## 2016-11-01 NOTE — ED Notes (Signed)
Pt arrived with [employer] officers. Officers states suspected drug use within last hour. Pt somnolent at triage. Pt complaining of sleepiness. Pt easily distracted, nodding off. Pt easily arousable.

## 2016-11-01 NOTE — ED Provider Notes (Signed)
Patient slept most of the day. In the afternoon was awake was following commands asked for food had food and drink. Still sleepy but she is stable for discharge home. Outpatient referral information provided for her substance abuse problem.   Vanetta MuldersScott Roshni Burbano, MD 11/01/16 [phone]

## 2016-11-02 ENCOUNTER — Telehealth: Payer: Self-pay

## 2016-11-02 NOTE — Telephone Encounter (Signed)
Post ED Visit - Positive Culture Follow-up  Culture report reviewed by antimicrobial stewardship pharmacist:  []  Enzo BiNathan Batchelder, Pharm.D. []  Celedonio MiyamotoJeremy Frens, Pharm.D., BCPS []  Garvin FilaMike Maccia, Pharm.D. []  Georgina PillionElizabeth Martin, Pharm.D., BCPS []  OvillaMinh Pham, [address]Pharm[address]D., BCPS, AAHIVP []  Estella HuskMichelle Turner, Pharm.D., BCPS, AAHIVP []  Tennis Mustassie Stewart, Pharm.D. []  Sherle Poeob Vincent, [address] BoulevardPharm.D.  Positive urine culture Treated with Nitrofurantoin Monohyd Macro, organism sensitive to the same and no further patient follow-up is required at this time.  Jerry CarasCullom, Beverley Sherrard Burnett 11/02/2016, 10:14 AM

## 2016-11-30 ENCOUNTER — Inpatient Hospital Stay (HOSPITAL_BASED_OUTPATIENT_CLINIC_OR_DEPARTMENT_OTHER)
Admission: EM | Admit: 2016-11-30 | Discharge: 2016-12-04 | DRG: 854 | Disposition: A | Discharge status: Home / Self Care | Payer: Medicaid Other | Attending: Internal Medicine | Admitting: Internal Medicine

## 2016-11-30 ENCOUNTER — Inpatient Hospital Stay (HOSPITAL_COMMUNITY): Payer: Medicaid Other

## 2016-11-30 ENCOUNTER — Encounter (HOSPITAL_BASED_OUTPATIENT_CLINIC_OR_DEPARTMENT_OTHER): Payer: Self-pay | Admitting: Emergency Medicine

## 2016-11-30 DIAGNOSIS — F419 Anxiety disorder, unspecified: Secondary | ICD-10-CM | POA: Diagnosis present

## 2016-11-30 DIAGNOSIS — L0291 Cutaneous abscess, unspecified: Secondary | ICD-10-CM | POA: Diagnosis present

## 2016-11-30 DIAGNOSIS — F17210 Nicotine dependence, cigarettes, uncomplicated: Secondary | ICD-10-CM | POA: Diagnosis present

## 2016-11-30 DIAGNOSIS — E871 Hypo-osmolality and hyponatremia: Secondary | ICD-10-CM | POA: Diagnosis present

## 2016-11-30 DIAGNOSIS — L02519 Cutaneous abscess of unspecified hand: Secondary | ICD-10-CM | POA: Diagnosis not present

## 2016-11-30 DIAGNOSIS — F1110 Opioid abuse, uncomplicated: Secondary | ICD-10-CM | POA: Diagnosis present

## 2016-11-30 DIAGNOSIS — B1920 Unspecified viral hepatitis C without hepatic coma: Secondary | ICD-10-CM | POA: Diagnosis present

## 2016-11-30 DIAGNOSIS — Z882 Allergy status to sulfonamides status: Secondary | ICD-10-CM | POA: Diagnosis not present

## 2016-11-30 DIAGNOSIS — Z880 Allergy status to penicillin: Secondary | ICD-10-CM

## 2016-11-30 DIAGNOSIS — E876 Hypokalemia: Secondary | ICD-10-CM | POA: Diagnosis present

## 2016-11-30 DIAGNOSIS — A419 Sepsis, unspecified organism: Secondary | ICD-10-CM | POA: Diagnosis present

## 2016-11-30 DIAGNOSIS — F1510 Other stimulant abuse, uncomplicated: Secondary | ICD-10-CM | POA: Diagnosis present

## 2016-11-30 DIAGNOSIS — L03119 Cellulitis of unspecified part of limb: Secondary | ICD-10-CM | POA: Insufficient documentation

## 2016-11-30 DIAGNOSIS — Z825 Family history of asthma and other chronic lower respiratory diseases: Secondary | ICD-10-CM | POA: Diagnosis not present

## 2016-11-30 DIAGNOSIS — T79A12A Traumatic compartment syndrome of left upper extremity, initial encounter: Secondary | ICD-10-CM | POA: Diagnosis present

## 2016-11-30 DIAGNOSIS — F17200 Nicotine dependence, unspecified, uncomplicated: Secondary | ICD-10-CM | POA: Diagnosis present

## 2016-11-30 DIAGNOSIS — Y92019 Unspecified place in single-family (private) house as the place of occurrence of the external cause: Secondary | ICD-10-CM

## 2016-11-30 DIAGNOSIS — I96 Gangrene, not elsewhere classified: Secondary | ICD-10-CM | POA: Diagnosis present

## 2016-11-30 DIAGNOSIS — L02512 Cutaneous abscess of left hand: Secondary | ICD-10-CM | POA: Diagnosis present

## 2016-11-30 DIAGNOSIS — Z885 Allergy status to narcotic agent status: Secondary | ICD-10-CM

## 2016-11-30 DIAGNOSIS — L03114 Cellulitis of left upper limb: Secondary | ICD-10-CM | POA: Diagnosis present

## 2016-11-30 HISTORY — DX: Other psychoactive substance abuse, uncomplicated: F19.10

## 2016-11-30 LAB — CBC WITH DIFFERENTIAL/PLATELET
BASOS PCT: 0 %
BASOS PCT: 0 %
Basophils Absolute: 0 10*3/uL (ref 0.0–0.1)
Basophils Absolute: 0 10*3/uL (ref 0.0–0.1)
EOS ABS: 0.2 10*3/uL (ref 0.0–0.7)
EOS ABS: 0.4 10*3/uL (ref 0.0–0.7)
EOS PCT: 1 %
Eosinophils Relative: 3 %
HCT: 31.6 % — ABNORMAL LOW (ref 36.0–46.0)
HCT: 29.7 % — ABNORMAL LOW (ref 36.0–46.0)
Hemoglobin: 11.4 g/dL — ABNORMAL LOW (ref 12.0–15.0)
Hemoglobin: 10.2 g/dL — ABNORMAL LOW (ref 12.0–15.0)
Lymphocytes Relative: 12 %
Lymphocytes Relative: 20 %
Lymphs Abs: 2 10*3/uL (ref 0.7–4.0)
Lymphs Abs: 2.3 10*3/uL (ref 0.7–4.0)
MCH: 27.5 pg (ref 26.0–34.0)
MCH: 26.6 pg (ref 26.0–34.0)
MCHC: 36.1 g/dL — AB (ref 30.0–36.0)
MCHC: 34.3 g/dL (ref 30.0–36.0)
MCV: 76.1 fL — ABNORMAL LOW (ref 78.0–100.0)
MCV: 77.3 fL — ABNORMAL LOW (ref 78.0–100.0)
MONO ABS: 1.5 10*3/uL — AB (ref 0.1–1.0)
MONO ABS: 1 10*3/uL (ref 0.1–1.0)
MONOS PCT: 9 %
MONOS PCT: 9 %
Neutro Abs: 13.1 10*3/uL — ABNORMAL HIGH (ref 1.7–7.7)
Neutro Abs: 7.6 10*3/uL (ref 1.7–7.7)
Neutrophils Relative %: 78 %
Neutrophils Relative %: 68 %
Platelets: 386 10*3/uL (ref 150–400)
Platelets: 326 10*3/uL (ref 150–400)
RBC: 4.15 MIL/uL (ref 3.87–5.11)
RBC: 3.84 MIL/uL — ABNORMAL LOW (ref 3.87–5.11)
RDW: 13.3 % (ref 11.5–15.5)
RDW: 13.5 % (ref 11.5–15.5)
WBC: 16.7 10*3/uL — ABNORMAL HIGH (ref 4.0–10.5)
WBC: 11.3 10*3/uL — ABNORMAL HIGH (ref 4.0–10.5)

## 2016-11-30 LAB — BASIC METABOLIC PANEL
Anion gap: 9 (ref 5–15)
BUN: 8 mg/dL (ref 6–20)
CALCIUM: 8.4 mg/dL — AB (ref 8.9–10.3)
CO2: 25 mmol/L (ref 22–32)
CREATININE: 0.77 mg/dL (ref 0.44–1.00)
Chloride: 95 mmol/L — ABNORMAL LOW (ref 101–111)
GFR calc Af Amer: >60 mL/min (ref >60)
GFR calc non Af Amer: >60 mL/min (ref >60)
GLUCOSE: 118 mg/dL — AB (ref 65–99)
Potassium: 3.2 mmol/L — ABNORMAL LOW (ref 3.5–5.1)
Sodium: 129 mmol/L — ABNORMAL LOW (ref 135–145)

## 2016-11-30 LAB — MAGNESIUM: Magnesium: 1.6 mg/dL — ABNORMAL LOW (ref 1.7–2.4)

## 2016-11-30 LAB — COMPREHENSIVE METABOLIC PANEL
ALBUMIN: 3.2 g/dL — AB (ref 3.5–5.0)
ALT: 36 U/L (ref 14–54)
AST: 36 U/L (ref 15–41)
Alkaline Phosphatase: 47 U/L (ref 38–126)
Anion gap: 8 (ref 5–15)
BILIRUBIN TOTAL: 0.4 mg/dL (ref 0.3–1.2)
BUN: 5 mg/dL — ABNORMAL LOW (ref 6–20)
CALCIUM: 8 mg/dL — AB (ref 8.9–10.3)
CO2: 24 mmol/L (ref 22–32)
CREATININE: 0.62 mg/dL (ref 0.44–1.00)
Chloride: 101 mmol/L (ref 101–111)
GFR calc non Af Amer: >60 mL/min (ref >60)
GLUCOSE: 68 mg/dL (ref 65–99)
Potassium: 3.3 mmol/L — ABNORMAL LOW (ref 3.5–5.1)
SODIUM: 133 mmol/L — AB (ref 135–145)
Total Protein: 6.9 g/dL (ref 6.5–8.1)

## 2016-11-30 LAB — PROCALCITONIN: Procalcitonin: 0.13 ng/mL

## 2016-11-30 LAB — C-REACTIVE PROTEIN: CRP: 19.1 mg/dL — ABNORMAL HIGH (ref <1.0)

## 2016-11-30 LAB — TYPE AND SCREEN
ABO/RH(D): B POS
Antibody Screen: NEGATIVE

## 2016-11-30 LAB — LACTIC ACID, PLASMA
Lactic Acid, Venous: 1 mmol/L (ref 0.5–1.9)
Lactic Acid, Venous: 2.4 mmol/L (ref 0.5–1.9)

## 2016-11-30 LAB — SEDIMENTATION RATE: SED RATE: 65 mm/h — AB (ref 0–22)

## 2016-11-30 LAB — PROTIME-INR
INR: 1.13
Prothrombin Time: 14.6 seconds (ref 11.4–15.2)

## 2016-11-30 LAB — APTT: APTT: 36 s (ref 24–36)

## 2016-11-30 LAB — ABO/RH: ABO/RH(D): B POS

## 2016-11-30 LAB — HCG, QUANTITATIVE, PREGNANCY: HCG, BETA CHAIN, QUANT, S: 2 m[IU]/mL (ref <5)

## 2016-11-30 LAB — CK: Total CK: 378 U/L — ABNORMAL HIGH (ref 38–234)

## 2016-11-30 MED ORDER — HYDROXYZINE HCL 25 MG PO TABS: 25.0000 mg | ORAL_TABLET | Freq: Three times a day (TID) | ORAL | Status: DC | PRN

## 2016-11-30 MED ORDER — POTASSIUM CHLORIDE CRYS ER 20 MEQ PO TBCR
40.0000 meq | EXTENDED_RELEASE_TABLET | Freq: Once | ORAL | Status: AC
  Administered 2016-11-30: 40 meq via ORAL
  Filled 2016-11-30: qty 2

## 2016-11-30 MED ORDER — SODIUM CHLORIDE 0.9 % IV BOLUS (SEPSIS)
2000.0000 mL | Freq: Once | INTRAVENOUS | Status: AC
  Administered 2016-11-30: 2000 mL via INTRAVENOUS

## 2016-11-30 MED ORDER — ENOXAPARIN SODIUM 40 MG/0.4ML ~~LOC~~ SOLN
40.0000 mg | SUBCUTANEOUS | Status: DC
  Administered 2016-12-02 – 2016-12-04 (×3): 40 mg via SUBCUTANEOUS
  Filled 2016-11-30 (×3): qty 0.4

## 2016-11-30 MED ORDER — DEXTROSE 5 % IV SOLN
1.0000 g | Freq: Three times a day (TID) | INTRAVENOUS | Status: DC
  Administered 2016-11-30 – 2016-12-03 (×8): 1 g via INTRAVENOUS
  Filled 2016-11-30 (×10): qty 1

## 2016-11-30 MED ORDER — DOCUSATE SODIUM 100 MG PO CAPS
100.0000 mg | ORAL_CAPSULE | Freq: Two times a day (BID) | ORAL | Status: DC
  Administered 2016-11-30 – 2016-12-04 (×7): 100 mg via ORAL
  Filled 2016-11-30 (×7): qty 1

## 2016-11-30 MED ORDER — VANCOMYCIN HCL IN DEXTROSE 1-5 GM/200ML-% IV SOLN
1000.0000 mg | Freq: Two times a day (BID) | INTRAVENOUS | Status: DC
  Administered 2016-12-01 – 2016-12-03 (×5): 1000 mg via INTRAVENOUS
  Filled 2016-11-30 (×7): qty 200

## 2016-11-30 MED ORDER — ACETAMINOPHEN 650 MG RE SUPP
650.0000 mg | Freq: Four times a day (QID) | RECTAL | Status: DC | PRN
Start: 2016-11-30 — End: 2016-12-04

## 2016-11-30 MED ORDER — LORAZEPAM 2 MG/ML IJ SOLN
0.5000 mg | Freq: Four times a day (QID) | INTRAMUSCULAR | Status: DC | PRN
  Administered 2016-11-30: 0.5 mg via INTRAVENOUS
  Filled 2016-11-30: qty 1

## 2016-11-30 MED ORDER — KETOROLAC TROMETHAMINE 30 MG/ML IJ SOLN
30.0000 mg | Freq: Four times a day (QID) | INTRAMUSCULAR | Status: DC
  Administered 2016-12-01 – 2016-12-04 (×15): 30 mg via INTRAVENOUS
  Filled 2016-11-30 (×14): qty 1

## 2016-11-30 MED ORDER — METRONIDAZOLE IN NACL 5-0.79 MG/ML-% IV SOLN
500.0000 mg | Freq: Three times a day (TID) | INTRAVENOUS | Status: DC
  Administered 2016-11-30 – 2016-12-03 (×8): 500 mg via INTRAVENOUS
  Filled 2016-11-30 (×10): qty 100

## 2016-11-30 MED ORDER — ONDANSETRON HCL 4 MG/2ML IJ SOLN
4.0000 mg | Freq: Four times a day (QID) | INTRAMUSCULAR | Status: DC | PRN
  Administered 2016-12-02 – 2016-12-03 (×3): 4 mg via INTRAVENOUS
  Filled 2016-11-30 (×3): qty 2

## 2016-11-30 MED ORDER — SODIUM CHLORIDE 0.9 % IV SOLN: INTRAVENOUS | Status: DC

## 2016-11-30 MED ORDER — METHOCARBAMOL 500 MG PO TABS
500.0000 mg | ORAL_TABLET | Freq: Three times a day (TID) | ORAL | Status: DC
  Administered 2016-11-30: 500 mg via ORAL
  Filled 2016-11-30: qty 1

## 2016-11-30 MED ORDER — ONDANSETRON HCL 4 MG PO TABS: 4.0000 mg | ORAL_TABLET | Freq: Four times a day (QID) | ORAL | Status: DC | PRN

## 2016-11-30 MED ORDER — ACETAMINOPHEN 325 MG PO TABS: 650.0000 mg | ORAL_TABLET | Freq: Four times a day (QID) | ORAL | Status: DC | PRN

## 2016-11-30 MED ORDER — VANCOMYCIN HCL IN DEXTROSE 1-5 GM/200ML-% IV SOLN
1000.0000 mg | Freq: Once | INTRAVENOUS | Status: AC
  Administered 2016-11-30: 1000 mg via INTRAVENOUS
  Filled 2016-11-30: qty 200

## 2016-11-30 MED ORDER — NICOTINE 21 MG/24HR TD PT24
21.0000 mg | MEDICATED_PATCH | Freq: Every day | TRANSDERMAL | Status: DC
  Administered 2016-11-30 – 2016-12-04 (×4): 21 mg via TRANSDERMAL
  Filled 2016-11-30 (×5): qty 1

## 2016-11-30 MED ORDER — POLYETHYLENE GLYCOL 3350 17 G PO PACK: 17.0000 g | PACK | Freq: Every day | ORAL | Status: DC | PRN

## 2016-11-30 NOTE — ED Notes (Signed)
ED Provider at bedside. 

## 2016-11-30 NOTE — Progress Notes (Addendum)
Pharmacy Antibiotic Note  Margot ChimesMelissa Lapp is a 31 y.o. female admitted on 11/30/2016 with cellulitis.  Pharmacy has been consulted for vancomycin dosing.  Plan: Vancomycin 1g IV once then will follow labs for maintenance dosing  Monitor culture data, renal function and clinical course VT at SS prn  Height: 5' 2.5" (158.8 cm) Weight: 140 lb (63.5 kg) IBW/kg (Calculated) : 51.25  Temp (24hrs), Avg:98.3 F (36.8 C), Min:98.3 F (36.8 C), Max:98.3 F (36.8 C)  No results for input(s): WBC, CREATININE, LATICACIDVEN, VANCOTROUGH, VANCOPEAK, VANCORANDOM, GENTTROUGH, GENTPEAK, GENTRANDOM, TOBRATROUGH, TOBRAPEAK, TOBRARND, AMIKACINPEAK, AMIKACINTROU, AMIKACIN in the last 168 hours.  CrCl cannot be calculated (Patient's most recent lab result is older than the maximum 21 days allowed.).    Allergies  Allergen Reactions  . Codeine Hives and Swelling  . Penicillins Hives and Swelling  . Sulfa Antibiotics Hives and Swelling     Arlean Hoppingorey M. Newman PiesBall, PharmD, BCPS Clinical Pharmacist [phone] 11/30/2016 2:14 PM  ______________________________________________  Update: BMET returned - CrCL ~90 mL/min. Start maintenance vancomycin 1000mg  IV every 12 hours. Monitor renal function, VT as indicated, clinical progress.
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
## 2016-11-30 NOTE — H&P (Signed)
[facility] History and Physical   Patient: Maria Abbott KDT:[id number]   PCP: No PCP Per Patient DOB: October 31, 1985   DOA: 11/30/2016   DOS: 11/30/2016   DOS: the patient was seen and examined on 11/30/2016  Patient coming from: The patient is coming from home  Chief Complaint: left arm swelling and redness  HPI: Maria Abbott is a 31 y.o. female with Past medical history of active smoker, drug abuse. He shouldn't injected IV Methamphetamine in her left hand, after that Patient started noticing left arm swelling for last 4 days progressively worsening. She noticed a swollen pustule, which she tried to drain with a pocket knife. Initially her hand swelling improved but later on it worsened with more redness as well as severe pain which is throbbing in nature. Pain is currently better but was 10 out of 10 on initiated. Because of that she decided to come to the hospital. Denies having any fever or chills no nausea no vomiting no rash anywhere else. No shortness of breath or chest pain. She also has history of left forearm plate placement for fracture when she was young. She smokes a pack a day, denies any alcohol abuse. Initially mentioned to the ER physician that she has used heroin. Denies any other drug abuse.  ED Course: Seen at [facility] [place], transferred to [facility] for surgical evaluation, was given IV vancomycin and 2 L of normal saline bolus  At her baseline ambulates without any support And is independent for most of her ADL; manages her medication on her own.  Review of Systems: as mentioned in the history of present illness.  All other systems reviewed and are negative.  Past Medical History:  Diagnosis Date  . Substance abuse    Past Surgical History:  Procedure Laterality Date  . EXTERNAL FIXATION ARM     Social History:  reports that she has been smoking Cigarettes.  She has been smoking about 1.25 packs per day. She has never used smokeless  tobacco. She reports that she drinks alcohol. She reports that she uses drugs, including IV.  Allergies  Allergen Reactions  . Codeine Hives and Swelling  . Penicillins Hives and Swelling  . Sulfa Antibiotics Hives and Swelling     Family History  Problem Relation Age of Onset  . Emphysema Other      Prior to Admission medications   Medication Sig Start Date End Date Taking? Authorizing Provider  AZO-CRANBERRY PO Take 2 tablets by mouth 3 (three) times daily as needed. For pain   Yes Historical Provider, MD  ibuprofen (ADVIL,MOTRIN) 600 MG tablet Take 600 mg by mouth every 6 (six) hours as needed.   Yes Historical Provider, MD    Physical Exam: Vitals:   11/30/16 1306 11/30/16 1536 11/30/16 1537 11/30/16 1556  BP: (!) 132/99 119/68 119/68 105/72  Pulse: (!) 132 (!) 102 (!) 102 (!) 101  Resp: _0 Temp: 98.3 F (36.8 C)  98.3 F (36.8 C) 98 F (36.7 C)  TempSrc: Oral  Oral Oral  SpO2: 98% 96% 100% 100%  Weight: 63.5 kg (140 lb)     Height: 5' 2.5" (1.588 m)       General: Alert, Awake and Oriented to Time, Place and Person. Appear in marked distress, affect appropriate Eyes: PERRL, Conjunctiva normal ENT: Oral Mucosa clear dry. Neck: no JVD, no Abnormal Mass Or lumps Cardiovascular: S1 and S2 Present, no Murmur, Peripheral Pulses Present, unable to palpate in the  left arm due to severe pain Respiratory: Bilateral Air entry equal and Decreased, no use of accessory muscle, Clear to Auscultation, no Crackles, no wheezes Abdomen: Bowel Sound present, Soft and no tenderness Skin: Significant redness with induration involving left hand as well as forearm with limited motion of the wrist, black discoloration of the skin at the ulcer site, open ulcer 1 cm in length 3 mm in width, patient holding left fingers in flexion Extremities: no Pedal edema, no calf tenderness Neurologic: Grossly no focal neuro deficit. Bilaterally Equal motor strength. Numbness of the left  arm  Labs on Admission:  CBC:  Recent Labs Lab 11/30/16 1520  WBC 16.7*  NEUTROABS 13.1*  HGB 11.4*  HCT 31.6*  MCV 76.1*  PLT 462   Basic Metabolic Panel:  Recent Labs Lab 11/30/16 1425  NA 129*  K 3.2*  CL 95*  CO2 25  GLUCOSE 118*  BUN 8  CREATININE 0.77  CALCIUM 8.4*   Urine analysis:    Component Value Date/Time   COLORURINE YELLOW 10/29/2016 2014   APPEARANCEUR TURBID (A) 10/29/2016 2014   APPEARANCEUR Cloudy 09/28/2014 1314   LABSPEC 1.019 10/29/2016 2014   LABSPEC 1.010 09/28/2014 1314   PHURINE 6.0 10/29/2016 2014   GLUCOSEU NEGATIVE 10/29/2016 2014   GLUCOSEU Negative 09/28/2014 1314   HGBUR MODERATE (A) 10/29/2016 2014   BILIRUBINUR NEGATIVE 10/29/2016 2014   BILIRUBINUR Negative 09/28/2014 [place] 10/29/2016 2014   PROTEINUR NEGATIVE 10/29/2016 2014   UROBILINOGEN 0.2 12/30/2013 0859   NITRITE NEGATIVE 10/29/2016 2014   LEUKOCYTESUR LARGE (A) 10/29/2016 2014   LEUKOCYTESUR 3+ 09/28/2014 1314    Radiological Exams on Admission: No results found.  Assessment/Plan 1. Cellulitis and abscess of hand Suspected compartment syndrome. Sepsis due to cellulitis.  Patient was transferred from [facility] for cellulitis and abscess of the hand. Has severe induration, limited mobility of the wrist joint, some numbness, serosanguineous discharge with foul-smelling, tachycardia and leukocytosis. She also has bluish black discoloration of the skin at that she has placed a Knife. At this point I'm concerned for possible compartment syndrome. Hand surgery has been consulted who will evaluate the patient in the hospital. I will keep the patient nothing by mouth at present. Will cover the patient broadly with IV vancomycin, IV cefepime as well as IV Flagyl. Has penicillin allergies but tolerated cephalosporins without any issues. Recommend to elevate the left upper extremity. We will get blood cultures, ESR, CRP, CPK, type and  screen. Also check lactic acid and progressive tone levels. Sepsis protocol was initiated in the ER, patient received 2 L of normal saline bolus, I would continue 125 mL's per hour fluid.  2. Pain management. 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 Check UDS. Pain management will be difficult, I would place the patient on scheduled Toradol 30 mg every 6 hours, scheduled Robaxin, when necessary Tylenol. Pain should improve after and elevation as well as possible surgical evacuation. PRN lorazepam for anxiety and withdrawal symptoms.  3. Hypokalemia, hyponatremia. Patient received IV normal saline bolus as well as potassium replacement in the ER. I will recheck the labs.  4. Active smoker. Counseled for quitting smoking. Nicotine patch at present.  5. Need to check pregnancy test prior to receiving any other medication at present.  Nutrition: Nothing by mouth until seen by hand surgery Dr. Lenon Curt DVT Prophylaxis: subcutaneous Heparin, starting tomorrow  Advance goals of care discussion: Full code   Consults: Hand surgery Dr. Lenon Curt  Family Communication: family was present at bedside, at the time of interview.  Opportunity was given to ask question and all questions were answered satisfactorily.  Disposition: Admitted as inpatient, med-surge unit. Likely to be discharged home, in 3-4 days.  Author: Berle Mull, MD [facility] Pager: [phone] 11/30/2016  If 7PM-7AM, please contact night-coverage [email or web address] Password [email or web address]
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
## 2016-12-01 ENCOUNTER — Inpatient Hospital Stay (HOSPITAL_COMMUNITY): Payer: Medicaid Other | Admitting: Certified Registered"

## 2016-12-01 ENCOUNTER — Encounter (HOSPITAL_COMMUNITY): Payer: Self-pay | Admitting: Certified Registered Nurse Anesthetist

## 2016-12-01 ENCOUNTER — Encounter (HOSPITAL_COMMUNITY)
Admission: EM | Disposition: A | Discharge status: Home / Self Care | Payer: Self-pay | Source: Home / Self Care | Attending: Internal Medicine

## 2016-12-01 HISTORY — PX: I & D EXTREMITY: SHX5045

## 2016-12-01 LAB — LACTIC ACID, PLASMA: LACTIC ACID, VENOUS: 0.8 mmol/L (ref 0.5–1.9)

## 2016-12-01 LAB — HEPATITIS B SURFACE ANTIGEN: Hepatitis B Surface Ag: NEGATIVE

## 2016-12-01 LAB — HEPATITIS C ANTIBODY (REFLEX)

## 2016-12-01 LAB — PREGNANCY, URINE: Preg Test, Ur: NEGATIVE

## 2016-12-01 LAB — MRSA PCR SCREENING: MRSA BY PCR: NEGATIVE

## 2016-12-01 LAB — COMMENT2 - HEP PANEL

## 2016-12-01 LAB — HIV ANTIBODY (ROUTINE TESTING W REFLEX): HIV Screen 4th Generation wRfx: NONREACTIVE

## 2016-12-01 SURGERY — IRRIGATION AND DEBRIDEMENT EXTREMITY
Anesthesia: General | Laterality: Left

## 2016-12-01 MED ORDER — LIDOCAINE HCL (CARDIAC) 20 MG/ML IV SOLN
INTRAVENOUS | Status: DC | PRN
  Administered 2016-12-01: 100 mg via INTRAVENOUS

## 2016-12-01 MED ORDER — SODIUM CHLORIDE 0.9 % IV BOLUS (SEPSIS): 500.0000 mL | Freq: Once | INTRAVENOUS | Status: DC

## 2016-12-01 MED ORDER — PROMETHAZINE HCL 25 MG/ML IJ SOLN
INTRAMUSCULAR | Status: AC
  Administered 2016-12-01: 10:00:00
  Filled 2016-12-01: qty 1

## 2016-12-01 MED ORDER — SUCCINYLCHOLINE CHLORIDE 200 MG/10ML IV SOSY
PREFILLED_SYRINGE | INTRAVENOUS | Status: AC
  Filled 2016-12-01: qty 10

## 2016-12-01 MED ORDER — SODIUM CHLORIDE 0.9 % IR SOLN
Status: DC | PRN
  Administered 2016-12-01: 3000 mL

## 2016-12-01 MED ORDER — HYDROMORPHONE HCL 1 MG/ML IJ SOLN
0.2500 mg | INTRAMUSCULAR | Status: DC | PRN
  Administered 2016-12-01: 0.5 mg via INTRAVENOUS

## 2016-12-01 MED ORDER — FENTANYL CITRATE (PF) 100 MCG/2ML IJ SOLN
INTRAMUSCULAR | Status: DC | PRN
  Administered 2016-12-01: 25 ug via INTRAVENOUS
  Administered 2016-12-01: 100 ug via INTRAVENOUS
  Administered 2016-12-01: 25 ug via INTRAVENOUS

## 2016-12-01 MED ORDER — PROPOFOL 10 MG/ML IV BOLUS
INTRAVENOUS | Status: AC
  Filled 2016-12-01: qty 20

## 2016-12-01 MED ORDER — FENTANYL CITRATE (PF) 100 MCG/2ML IJ SOLN
INTRAMUSCULAR | Status: AC
  Filled 2016-12-01: qty 2

## 2016-12-01 MED ORDER — BUPIVACAINE HCL (PF) 0.25 % IJ SOLN
INTRAMUSCULAR | Status: DC | PRN
  Administered 2016-12-01: 20 mL

## 2016-12-01 MED ORDER — LIDOCAINE 2% (20 MG/ML) 5 ML SYRINGE
INTRAMUSCULAR | Status: AC
  Filled 2016-12-01: qty 5

## 2016-12-01 MED ORDER — CYCLOBENZAPRINE HCL 10 MG PO TABS
5.0000 mg | ORAL_TABLET | Freq: Three times a day (TID) | ORAL | Status: DC
  Administered 2016-12-01 – 2016-12-04 (×10): 5 mg via ORAL
  Filled 2016-12-01 (×10): qty 1

## 2016-12-01 MED ORDER — PROPOFOL 10 MG/ML IV BOLUS
INTRAVENOUS | Status: DC | PRN
  Administered 2016-12-01: 200 mg via INTRAVENOUS

## 2016-12-01 MED ORDER — PROMETHAZINE HCL 25 MG/ML IJ SOLN
6.2500 mg | INTRAMUSCULAR | Status: DC | PRN
  Administered 2016-12-01: 6.25 mg via INTRAVENOUS

## 2016-12-01 MED ORDER — ONDANSETRON HCL 4 MG/2ML IJ SOLN
INTRAMUSCULAR | Status: DC | PRN
  Administered 2016-12-01: 4 mg via INTRAVENOUS

## 2016-12-01 MED ORDER — HYDROMORPHONE HCL 1 MG/ML IJ SOLN
INTRAMUSCULAR | Status: AC
  Administered 2016-12-01: 10:00:00
  Filled 2016-12-01: qty 0.5

## 2016-12-01 MED ORDER — SODIUM CHLORIDE 0.9 % IV BOLUS (SEPSIS)
1000.0000 mL | Freq: Once | INTRAVENOUS | Status: AC
  Administered 2016-12-01: 1000 mL via INTRAVENOUS

## 2016-12-01 MED ORDER — MIDAZOLAM HCL 5 MG/5ML IJ SOLN
INTRAMUSCULAR | Status: DC | PRN
  Administered 2016-12-01: 1 mg via INTRAVENOUS

## 2016-12-01 MED ORDER — BUPIVACAINE HCL (PF) 0.25 % IJ SOLN
INTRAMUSCULAR | Status: AC
  Filled 2016-12-01: qty 30

## 2016-12-01 MED ORDER — CYCLOBENZAPRINE HCL 10 MG PO TABS: 5.0000 mg | ORAL_TABLET | Freq: Three times a day (TID) | ORAL | Status: DC | PRN

## 2016-12-01 MED ORDER — MIDAZOLAM HCL 2 MG/2ML IJ SOLN
INTRAMUSCULAR | Status: AC
  Filled 2016-12-01: qty 2

## 2016-12-01 MED ORDER — LACTATED RINGERS IV SOLN
INTRAVENOUS | Status: DC
  Administered 2016-12-01 (×2): via INTRAVENOUS

## 2016-12-01 MED ORDER — HYDROCODONE-ACETAMINOPHEN 5-325 MG PO TABS
1.0000 | ORAL_TABLET | Freq: Four times a day (QID) | ORAL | Status: DC | PRN
  Administered 2016-12-01 – 2016-12-04 (×11): 2 via ORAL
  Filled 2016-12-01 (×11): qty 2

## 2016-12-01 MED ORDER — ROCURONIUM BROMIDE 50 MG/5ML IV SOSY
PREFILLED_SYRINGE | INTRAVENOUS | Status: AC
  Filled 2016-12-01: qty 5

## 2016-12-01 SURGICAL SUPPLY — 42 items
BAG DECANTER FOR FLEXI CONT (MISCELLANEOUS) ×3 IMPLANT
BANDAGE ACE 3X5.8 VEL STRL LF (GAUZE/BANDAGES/DRESSINGS) ×2 IMPLANT
BANDAGE ACE 4X5 VEL STRL LF (GAUZE/BANDAGES/DRESSINGS) ×2 IMPLANT
BANDAGE ELASTIC 3 VELCRO ST LF (GAUZE/BANDAGES/DRESSINGS) IMPLANT
BNDG GAUZE ELAST 4 BULKY (GAUZE/BANDAGES/DRESSINGS) ×2 IMPLANT
CORDS BIPOLAR (ELECTRODE) IMPLANT
CUFF TOURNIQUET SINGLE 18IN (TOURNIQUET CUFF) IMPLANT
DRAPE SURG 17X23 STRL (DRAPES) ×3 IMPLANT
ELECT REM PT RETURN 9FT ADLT (ELECTROSURGICAL)
ELECTRODE REM PT RTRN 9FT ADLT (ELECTROSURGICAL) IMPLANT
GAUZE PACKING IODOFORM 1/4X5 (PACKING) IMPLANT
GAUZE SPONGE 4X4 12PLY STRL (GAUZE/BANDAGES/DRESSINGS) ×3 IMPLANT
GAUZE SPONGE 4X4 12PLY STRL LF (GAUZE/BANDAGES/DRESSINGS) ×2 IMPLANT
GAUZE XEROFORM 1X8 LF (GAUZE/BANDAGES/DRESSINGS) ×1 IMPLANT
GAUZE XEROFORM 5X9 LF (GAUZE/BANDAGES/DRESSINGS) ×2 IMPLANT
GLOVE BIOGEL M 8.0 STRL (GLOVE) ×3 IMPLANT
GOWN STRL REUS W/ TWL LRG LVL3 (GOWN DISPOSABLE) ×2 IMPLANT
GOWN STRL REUS W/TWL LRG LVL3 (GOWN DISPOSABLE) ×6
HANDPIECE INTERPULSE COAX TIP (DISPOSABLE)
KIT BASIN OR (CUSTOM PROCEDURE TRAY) ×3 IMPLANT
KIT ROOM TURNOVER OR (KITS) ×3 IMPLANT
MANIFOLD NEPTUNE II (INSTRUMENTS) ×3 IMPLANT
NDL HYPO 25GX1X1/2 BEV (NEEDLE) IMPLANT
NEEDLE HYPO 25GX1X1/2 BEV (NEEDLE) ×3 IMPLANT
NS IRRIG 1000ML POUR BTL (IV SOLUTION) ×3 IMPLANT
PACK ORTHO EXTREMITY (CUSTOM PROCEDURE TRAY) ×3 IMPLANT
PAD ARMBOARD 7.5X6 YLW CONV (MISCELLANEOUS) ×6 IMPLANT
PAD CAST 4YDX4 CTTN HI CHSV (CAST SUPPLIES) IMPLANT
PADDING CAST COTTON 4X4 STRL (CAST SUPPLIES) ×3
SET HNDPC FAN SPRY TIP SCT (DISPOSABLE) IMPLANT
SOAP 2 % CHG 4 OZ (WOUND CARE) ×3 IMPLANT
SPLINT FIBERGLASS 3X12 (CAST SUPPLIES) ×2 IMPLANT
SPONGE LAP 18X18 X RAY DECT (DISPOSABLE) IMPLANT
SPONGE LAP 4X18 X RAY DECT (DISPOSABLE) ×3 IMPLANT
SYR CONTROL 10ML LL (SYRINGE) IMPLANT
TOWEL OR 17X24 6PK STRL BLUE (TOWEL DISPOSABLE) ×3 IMPLANT
TOWEL OR 17X26 10 PK STRL BLUE (TOWEL DISPOSABLE) ×3 IMPLANT
TUBE ANAEROBIC SPECIMEN COL (MISCELLANEOUS) IMPLANT
TUBE CONNECTING 12'X1/4 (SUCTIONS) ×1
TUBE CONNECTING 12X1/4 (SUCTIONS) ×2 IMPLANT
WATER STERILE IRR 1000ML POUR (IV SOLUTION) ×3 IMPLANT
YANKAUER SUCT BULB TIP NO VENT (SUCTIONS) ×3 IMPLANT

## 2016-12-01 NOTE — Op Note (Deleted)
  The note originally documented on this encounter has been moved the the encounter in which it belongs.  

## 2016-12-01 NOTE — Progress Notes (Signed)
[facility] Progress Note  Patient: Maria Abbott YIA:[id number]   PCP: No PCP Per Patient DOB: 06/21/1986   DOA: 11/30/2016   DOS: 12/01/2016   Date of Service: the patient was seen and examined on 12/01/2016  Subjective: Feeling better, pain has been well controlled off the surgery. No nausea no vomiting.  Brief hospital course: Maria Abbott is a 31 y.o. female with Past medical history of active smoker, drug abuse. Pt injected IV Methamphetamine in her left hand, after that Patient started noticing left arm swelling for last 4 days progressively worsening. She noticed a swollen pustule, which she tried to drain with a pocket knife. Initially her hand swelling improved but later on it worsened with more redness as well as severe pain which is throbbing in nature. Pain is currently better but was 10 out of 10 on initiated. Because of that she decided to come to the hospital. Denies having any fever or chills no nausea no vomiting no rash anywhere else. No shortness of breath or chest pain. She also has history of left forearm plate placement for fracture when she was young. She smokes a pack a day, denies any alcohol abuse. Initially mentioned to the ER physician that she has used heroin. Denies any other drug abuse.  Assessment and Plan: 1. Cellulitis and abscess of hand Suspected compartment syndrome. Sepsis due to cellulitis. Suspected gas-forming infection based on the x-ray.   Patient was transferred from [facility] [place] for cellulitis and abscess of the hand. Has severe induration, limited mobility of the wrist joint, some numbness, serosanguineous discharge with foul-smelling, tachycardia and leukocytosis. She also has bluish black discoloration of the skin at that she has placed a Knife. concerned for possible compartment syndrome. Hand surgery has been consulted and patient underwent incision and debridement.  Will cover the patient broadly with IV vancomycin,  IV cefepime as well as IV Flagyl. Has penicillin allergies but tolerated cephalosporins without any issues. Recommend to elevate the left upper extremity. Follow blood cultures, Elevated ESR, CRP, CPK. Sepsis protocol was initiated in the ER, patient received 2 L of normal saline bolus, I would continue 125 mL's per hour fluid. Gram stain is showing abundant gram-negative rods, gram-positive cocci in pair, rare gram positive rods.  2. Pain management. 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 Check UDS. Pain management will be difficult,  I would continue the patient on scheduled Toradol 30 mg every 6 hours, scheduled Flexeril, when necessary Tylenol. Added oral Norco, avoid IV narcotics. Pain should improve after and elevation as well as possible surgical evacuation. PRN lorazepam for anxiety and withdrawal symptoms.  3. Hypokalemia, hyponatremia. Patient received IV normal saline bolus as well as potassium replacement in the ER. Currently resolved, monitor  4. Active smoker. Counseled for quitting smoking. Nicotine patch at present.  5. Mild elevation of beta-hCG not consistent with positive pregnancy. Check urine pregnancy test  6. Hepatitis C infection. HCV RNA is positive, will discuss with patient and order HCV viral load. Will need Outpatient therapy.  Bowel regimen: last BM prior to admission  Diet: regular diet DVT Prophylaxis: subcutaneous Heparin  Advance goals of care discussion: full code  Family Communication: family was present at bedside, at the time of interview. The pt provided permission to discuss medical plan with the family. Opportunity was given to ask question and all questions were answered satisfactorily.   Disposition:  Discharge to home. Expected discharge date: 12/03/2016, depending on the results of the Gram stain and culture.  Consultants: hand  surgery Procedures: Irrigation and drainage of the abscess  Antibiotics: Anti-infectives    Start     Dose/Rate Route Frequency Ordered Stop   12/01/16 0300  vancomycin (VANCOCIN) IVPB 1000 mg/200 mL premix     1,000 mg 200 mL/hr over 60 Minutes Intravenous Every 12 hours 11/30/16 1635     11/30/16 2000  ceFEPIme (MAXIPIME) 1 g in dextrose 5 % 50 mL IVPB     1 g 100 mL/hr over 30 Minutes Intravenous Every 8 hours 11/30/16 1809     11/30/16 1900  metroNIDAZOLE (FLAGYL) IVPB 500 mg     500 mg 100 mL/hr over 60 Minutes Intravenous Every 8 hours 11/30/16 1801     11/30/16 1430  vancomycin (VANCOCIN) IVPB 1000 mg/200 mL premix     1,000 mg 200 mL/hr over 60 Minutes Intravenous  Once 11/30/16 1416 11/30/16 1530       Objective: Physical Exam: Vitals:   12/01/16 1015 12/01/16 1030 12/01/16 1052 12/01/16 1426  BP: 102/76 103/76 133/67 126/67  Pulse: 78 77 74 88  Resp: _0 Temp: 97.7 F (36.5 C) 97.8 F (36.6 C) 97.9 F (36.6 C) 99 F (37.2 C)  TempSrc:   Oral   SpO2: 100% 100% 100% 100%  Weight:      Height:        Intake/Output Summary (Last 24 hours) at 12/01/16 1603 Last data filed at 12/01/16 1500  Gross per 24 hour  Intake             3546 ml  Output              525 ml  Net             3021 ml   Filed Weights   11/30/16 1306  Weight: 63.5 kg (140 lb)   General: Alert, Awake and Oriented to Time, Place and Person. Appear in mild distress, affect appropriate Eyes: PERRL, Conjunctiva normal ENT: Oral Mucosa clear moist. Neck: no JVD, no Abnormal Mass Or lumps Cardiovascular: S1 and S2 Present, no Murmur, Respiratory: Bilateral Air entry equal and Decreased, no use of accessory muscle, Clear to Auscultation, no Crackles, no wheezes Abdomen: Bowel Sound present, Soft and no tenderness Skin: no redness, no Rash, no induration Extremities: no Pedal edema, no calf tenderness Neurologic: Grossly no focal neuro deficit. Bilaterally Equal motor strength  Data  Reviewed: CBC:  Recent Labs Lab 11/30/16 1520 11/30/16 1924  WBC 16.7* 11.3*  NEUTROABS 13.1* 7.6  HGB 11.4* 10.2*  HCT 31.6* 29.7*  MCV 76.1* 77.3*  PLT 386 622   Basic Metabolic Panel:  Recent Labs Lab 11/30/16 1425 11/30/16 1924  NA 129* 133*  K 3.2* 3.3*  CL 95* 101  CO2 25 24  GLUCOSE 118* 68  BUN 8 5*  CREATININE 0.77 0.62  CALCIUM 8.4* 8.0*  MG  --  1.6*    Liver Function Tests:  Recent Labs Lab 11/30/16 1924  AST 36  ALT 36  ALKPHOS 47  BILITOT 0.4  PROT 6.9  ALBUMIN 3.2*   No results for input(s): LIPASE, AMYLASE in the last 168 hours. No results for input(s): AMMONIA in the last 168 hours. Coagulation Profile:  Recent Labs Lab 11/30/16 1924  INR 1.13   Cardiac Enzymes:  Recent Labs Lab 11/30/16 1924  CKTOTAL 378*   BNP (last 3 results) No  results for input(s): PROBNP in the last 8760 hours. CBG: No results for input(s): GLUCAP in the last 168 hours. Studies: Dg Forearm Left  Result Date: 11/30/2016 CLINICAL DATA:  Cellulitis of the left hand and wrist EXAM: LEFT FOREARM - 2 VIEW COMPARISON:  None. FINDINGS: Surgical plate and screw fixation of the distal ulna with grossly intact hardware. Diffuse subcutaneous edema with multiple pockets of soft tissue gas along the distal dorsal forearm and wrist. No fracture or periostitis. No radiopaque foreign body. IMPRESSION: 1. Surgical plate and screw fixation of the distal ulna. No acute osseous abnormality 2. Diffuse soft tissue swelling with soft tissue gas present along the dorsal aspect of the distal forearm and wrist. Electronically Signed   By: Donavan Foil M.D.   On: 11/30/2016 20:54   Dg Hand Complete Left  Result Date: 11/30/2016 CLINICAL DATA:  Pt has cellulitis of the left hand, wrist, and forearm. Pt had open wound on the posterior aspect of the wrist, that was oozing with strong odor. Previous sx x 8 yrs ago. Best obtainable images due to limited range of motion because of  inflammation. EXAM: LEFT HAND - COMPLETE 3+ VIEW COMPARISON:  None. FINDINGS: Subcutaneous gas in the lateral ulnar aspect of the rib extending from the carpal metacarpal level to the distal ulnar diaphysis. Gas extends over an 8 cm length in a linear distribution. Dynamic fixation plate of the distal ulna. IMPRESSION: Subcutaneous gas in the dorsum of the LEFT wrist along the ulnar side spanning the radiocarpal joint. Findings consistent with gas forming bacterial infection. Dynamic fixation plate along the distal ulna. These results will be called to the ordering clinician or representative by the Radiologist Assistant, and communication documented in the PACS or zVision Dashboard. Electronically Signed   By: Suzy Bouchard M.D.   On: 11/30/2016 20:53    Scheduled Meds: . ceFEPime (MAXIPIME) IV  1 g Intravenous Q8H  . cyclobenzaprine  5 mg Oral TID  . docusate sodium  100 mg Oral BID  . enoxaparin (LOVENOX) injection  40 mg Subcutaneous Q24H  . ketorolac  30 mg Intravenous Q6H  . metronidazole  500 mg Intravenous Q8H  . nicotine  21 mg Transdermal Daily  . sodium chloride  500 mL Intravenous Once  . vancomycin  1,000 mg Intravenous Q12H   Continuous Infusions: . sodium chloride    . lactated ringers 10 mL/hr at 12/01/16 0823   PRN Meds: acetaminophen **OR** acetaminophen, HYDROcodone-acetaminophen, hydrOXYzine, LORazepam, ondansetron **OR** ondansetron (ZOFRAN) IV, polyethylene glycol  Time spent: 30 minutes  Author: Berle Mull, MD [facility] Pager: [phone] 12/01/2016 4:03 PM  If 7PM-7AM, please contact night-coverage at [email or web address], password [facility]

## 2016-12-01 NOTE — Progress Notes (Signed)
F/U call to Dr Izora Ribasoley regarding post op florr pain meds- pt had Marcaine injected into wound during surgery towards end of case per OR report. Pain management to be handled by hospitalist per Dr Izora Ribasoley.

## 2016-12-01 NOTE — Transfer of Care (Signed)
Immediate Anesthesia Transfer of Care Note  Patient: Maria Abbott  Procedure(s) Performed: Procedure(s): IRRIGATION AND DEBRIDEMENT EXTREMITY (Left)  Patient Location: PACU  Anesthesia Type:General  Level of Consciousness: awake, alert  and oriented  Airway & Oxygen Therapy: Patient Spontanous Breathing  Post-op Assessment: Report given to RN and Post -op Vital signs reviewed and stable  Post vital signs: Reviewed and stable  Last Vitals:  Vitals:   11/30/16 2209 12/01/16 0657  BP: (!) 102/59 (!) 96/50  Pulse: (!) 106 82  Resp: 16 16  Temp: 37.7 C 36.8 C    Last Pain:  Vitals:   12/01/16 0800  TempSrc:   PainSc: Asleep         Complications: No apparent anesthesia complications

## 2016-12-01 NOTE — Progress Notes (Signed)
CRITICAL VALUE ALERT  Critical value received:  Lactic acid 2.4  Date of notification:  11/30/16  Time of notification:  2230  Critical value read back: yes  Nurse who received alert:  Y. Phila Shoaf  MD notified (1st page):  Arvilla MarketA. Kakrakandy  Time of first page:  2300  Responding MD:  Arvilla MarketA. Kakrakandy  Time MD responded:  [phone]

## 2016-12-01 NOTE — Anesthesia Preprocedure Evaluation (Signed)
Anesthesia Evaluation  Patient identified by MRN, date of birth, ID band Patient awake    Reviewed: Allergy & Precautions, NPO status , Patient's Chart, lab work & pertinent test results  History of Anesthesia Complications Negative for: history of anesthetic complications  Airway Mallampati: II  TM Distance: >3 FB Neck ROM: Limited    Dental  (+) Poor Dentition, Dental Advisory Given   Pulmonary Current Smoker,    Pulmonary exam normal        Cardiovascular negative cardio ROS Normal cardiovascular exam     Neuro/Psych negative neurological ROS  negative psych ROS   GI/Hepatic negative GI ROS, Neg liver ROS,   Endo/Other  negative endocrine ROS  Renal/GU negative Renal ROS  negative genitourinary   Musculoskeletal   Abdominal   Peds negative pediatric ROS (+)  Hematology negative hematology ROS (+)   Anesthesia Other Findings   Reproductive/Obstetrics negative OB ROS                             Anesthesia Physical Anesthesia Plan  ASA: II  Anesthesia Plan: General   Post-op Pain Management:    Induction: Intravenous  Airway Management Planned: LMA  Additional Equipment:   Intra-op Plan:   Post-operative Plan: Extubation in OR  Informed Consent: I have reviewed the patients History and Physical, chart, labs and discussed the procedure including the risks, benefits and alternatives for the proposed anesthesia with the patient or authorized representative who has indicated his/her understanding and acceptance.   Dental advisory given  Plan Discussed with: CRNA, Anesthesiologist and Surgeon  Anesthesia Plan Comments:         Anesthesia Quick Evaluation

## 2016-12-01 NOTE — Anesthesia Postprocedure Evaluation (Addendum)
Anesthesia Post Note  Patient: Maria Abbott  Procedure(s) Performed: Procedure(s) (LRB): IRRIGATION AND DEBRIDEMENT EXTREMITY (Left)  Patient location during evaluation: PACU Anesthesia Type: General Level of consciousness: sedated Pain management: pain level controlled Vital Signs Assessment: post-procedure vital signs reviewed and stable Respiratory status: spontaneous breathing and respiratory function stable Cardiovascular status: stable Anesthetic complications: no       Last Vitals:  Vitals:   12/01/16 1030 12/01/16 1052  BP: 103/76 133/67  Pulse: 77 74  Resp: 13   Temp: 36.6 C 36.6 C    Last Pain:  Vitals:   12/01/16 1052  TempSrc: Oral  PainSc:                  [name] DANIEL

## 2016-12-01 NOTE — Op Note (Signed)
NAMMargot Abbott:  Abbott, Maria           ACCOUNT NO.:  [id number]  MEDICAL RECORD NO.:  [id number]  LOCATION:                               FACILITY:  [facility]  PHYSICIAN:  Johnette AbrahamHarrill C Coron Rossano, MD    DATE OF BIRTH:  04-21-1986  DATE OF PROCEDURE:  12/01/2016 DATE OF DISCHARGE:                              OPERATIVE REPORT   PREOPERATIVE DIAGNOSIS:  Severe infection abscess of the left dorsal hand.  POSTOPERATIVE DIAGNOSIS:  Severe infection abscess of the left dorsal hand.  PROCEDURE:  Incision and drainage of left hand abscess.  INDICATIONS:  Ms. Maria Abbott is a 31 year old female, IV drug abuser, who injected about 4 days ago of the dorsal left hand.  Since then, hand has become red, swollen.  She opened a pustule up with pocket knife and some purulent drainage was expressed.  Her hand became worse.  She presented to the emergency room for definitive form.  Risks, benefits, and alternatives of surgical I and D were discussed with her nurse and her significant other.  They agreed to proceed with I and D.  Consent was obtained.  PROCEDURE DESCRIPTION:  The patient was taken to the operating room, placed supine on the operating room table.  General anesthesia was administered without difficulty.  The left upper extremity was prepped and draped in normal sterile fashion.  The arm was elevated, tourniquet was used on the upper extremity inflated to 250 mmHg.  The small I and D site that the patient had performed was lengthened to the full extent of the intense erythema immediately upon incising through the subcutaneous tissue.  There was copious amount of foul-smelling pus that was encountered.  There was a small necrotic area in the skin about 1 cm round.  This necrotic tissue was sharply excised.  The infection seemed to be mostly subcutaneous.  There was quite a bit of fibrinous exudate onto the extensor tendon sheath.  There did not appear to be any bulging of the radiocarpal joint or  wrist joint to indicate the joint infection.  Next, sharp debridement with a knife, debridement with curette and sponge, the subcutaneous tissues were debrided, irrigated with 3 L of irrigation solution until the wound base appeared clean and without necrotic or infected material.  Tourniquet was then released.  There was diffuse bleeding from the subcutaneous tissues.  There was no major bleeding from any arterial venous structures.  Hemostasis was controlled with bipolar and direct pressure.  Afterwards, a couple of vertical mattress sutures were used to loosely approximate the wound.  The wound was then dressed in the following manner.  Xeroform gauze was placed in the areas between the sutures, 4x4s, Kerlix, and a volar resting splint were placed.  The patient tolerated procedure well and was taken to the recovery room in stable condition.     Johnette AbrahamHarrill C Maria Irigoyen, MD   ______________________________ Johnette AbrahamHarrill C Jeneen Doutt, MD    HCC/MEDQ  D:  12/01/2016  T:  12/01/2016  Job:  [id number]

## 2016-12-01 NOTE — Anesthesia Procedure Notes (Signed)
Procedure Name: LMA Insertion Date/Time: 12/01/2016 8:59 AM Performed by: Dairl PonderJIANG, Isaack Preble Pre-anesthesia Checklist: Patient identified, Emergency Drugs available, Suction available, Patient being monitored and Timeout performed Patient Re-evaluated:Patient Re-evaluated prior to inductionOxygen Delivery Method: Circle system utilized Preoxygenation: Pre-oxygenation with 100% oxygen Intubation Type: IV induction LMA: LMA inserted LMA Size: 4.0 Number of attempts: 1 Placement Confirmation: positive ETCO2 and breath sounds checked- equal and bilateral Tube secured with: Tape Dental Injury: Teeth and Oropharynx as per pre-operative assessment

## 2016-12-02 LAB — CBC WITH DIFFERENTIAL/PLATELET
BASOS ABS: 0 10*3/uL (ref 0.0–0.1)
Basophils Relative: 1 %
Eosinophils Absolute: 0.4 10*3/uL (ref 0.0–0.7)
Eosinophils Relative: 11 %
HCT: 27.3 % — ABNORMAL LOW (ref 36.0–46.0)
HEMOGLOBIN: 9.2 g/dL — AB (ref 12.0–15.0)
LYMPHS PCT: 29 %
Lymphs Abs: 1.1 10*3/uL (ref 0.7–4.0)
MCH: 26.9 pg (ref 26.0–34.0)
MCHC: 33.7 g/dL (ref 30.0–36.0)
MCV: 79.8 fL (ref 78.0–100.0)
Monocytes Absolute: 0.5 10*3/uL (ref 0.1–1.0)
Monocytes Relative: 12 %
NEUTROS ABS: 1.9 10*3/uL (ref 1.7–7.7)
NEUTROS PCT: 47 %
Platelets: 244 10*3/uL (ref 150–400)
RBC: 3.42 MIL/uL — ABNORMAL LOW (ref 3.87–5.11)
RDW: 14.6 % (ref 11.5–15.5)
WBC: 3.9 10*3/uL — AB (ref 4.0–10.5)

## 2016-12-02 LAB — COMPREHENSIVE METABOLIC PANEL
ALBUMIN: 2.2 g/dL — AB (ref 3.5–5.0)
ALK PHOS: 35 U/L — AB (ref 38–126)
ALT: 36 U/L (ref 14–54)
ANION GAP: 7 (ref 5–15)
AST: 46 U/L — ABNORMAL HIGH (ref 15–41)
BUN: 6 mg/dL (ref 6–20)
CALCIUM: 7.7 mg/dL — AB (ref 8.9–10.3)
CO2: 21 mmol/L — AB (ref 22–32)
Chloride: 111 mmol/L (ref 101–111)
Creatinine, Ser: 0.66 mg/dL (ref 0.44–1.00)
GFR calc Af Amer: >60 mL/min (ref >60)
GFR calc non Af Amer: >60 mL/min (ref >60)
GLUCOSE: 95 mg/dL (ref 65–99)
Potassium: 3.5 mmol/L (ref 3.5–5.1)
Sodium: 139 mmol/L (ref 135–145)
Total Bilirubin: 0.4 mg/dL (ref 0.3–1.2)
Total Protein: 5.3 g/dL — ABNORMAL LOW (ref 6.5–8.1)

## 2016-12-02 LAB — MAGNESIUM: Magnesium: 2 mg/dL (ref 1.7–2.4)

## 2016-12-02 NOTE — Progress Notes (Signed)
S:  Hand feels better  O:Blood pressure (!) 127/55, pulse 68, temperature 97.4 F (36.3 C), temperature source Oral, resp. rate 16, height 5' 2.5" (1.588 m), weight 63.5 kg (140 lb), last menstrual period 10/31/2016, SpO2 97 %.  Dressing changed, wound relatively clean, no gross purulent drainage, decreased erythema  A: s/p I&D L hand   P: cont and tailor abx, wound care, wash out wounds TID, demonstrated to patient, f/u in office in 7-10 days for wound check, suture removal.

## 2016-12-02 NOTE — Progress Notes (Signed)
[facility] Progress Note  Patient: Maria Abbott XJO:[id number]   PCP: No PCP Per Patient DOB: November 08, 1985   DOA: 11/30/2016   DOS: 12/02/2016   Date of Service: the patient was seen and examined on 12/02/2016  Subjective: Feeling better, pain has been well controlled Denies any diarrhea.  Brief hospital course: Maria Abbott is a 31 y.o. female with Past medical history of active smoker, drug abuse. Pt injected IV Methamphetamine in her left hand, after that Patient started noticing left arm swelling for last 4 days progressively worsening. She noticed a swollen pustule, which she tried to drain with a pocket knife. Initially her hand swelling improved but later on it worsened with more redness as well as severe pain which is throbbing in nature. Pain is currently better but was 10 out of 10 on initiated. Because of that she decided to come to the hospital. Denies having any fever or chills no nausea no vomiting no rash anywhere else. No shortness of breath or chest pain. She also has history of left forearm plate placement for fracture when she was young. She smokes a pack a day, denies any alcohol abuse. Initially mentioned to the ER physician that she has used heroin. Denies any other drug abuse.  Assessment and Plan: 1. Cellulitis and abscess of hand Suspected compartment syndrome. Sepsis due to cellulitis. Suspected gas-forming infection based on the x-ray.   Patient was transferred from [facility] [place] for cellulitis and abscess of the hand. Has severe induration, limited mobility of the wrist joint, some numbness, serosanguineous discharge with foul-smelling, tachycardia and leukocytosis. She also has bluish black discoloration of the skin at that she has placed a Knife. concerned for possible compartment syndrome. Hand surgery has been consulted and patient underwent incision and debridement.  Will cover the patient broadly with IV vancomycin, IV cefepime as well  as IV Flagyl. Has penicillin allergies but tolerated cephalosporins without any issues. Recommend to elevate the left upper extremity. Follow blood cultures, Elevated ESR, CRP, CPK. Sepsis protocol was initiated in the ER, patient received 2 L of normal saline bolus, I would continue 80m's per hour fluid. Gram stain is showing abundant gram-negative rods, gram-positive cocci in pair, rare gram positive rods. Awaiting Recommendation from hand surgery regarding wound management as an outpatient  2. Pain management. 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 Check UDS. In the past she was positive for opiates, cocaine, benzodiazepine, cannabis, she mentions she has used methamphetamine and heroin this time. Social worker consulted for assistance with drug abuse.  Pain management will be difficult,  I would continue the patient on scheduled Toradol 30 mg every 6 hours, scheduled Flexeril, when necessary Tylenol. Added oral Norco, avoid IV narcotics. PRN lorazepam for anxiety and withdrawal symptoms.  3. Hypokalemia, hyponatremia. Patient received IV normal saline bolus as well as potassium replacement in the ER. Currently resolved, monitor  4. Active smoker. Counseled for quitting smoking. Nicotine patch at present.  5. Mild elevation of beta-hCG not consistent with positive pregnancy. Check urine pregnancy test  6. Hepatitis C infection. HCV RNA is positive, discussed with patient. Will need Outpatient therapy.  Bowel regimen: last BM prior to admission  Diet: regular diet DVT Prophylaxis: subcutaneous Heparin  Advance goals of care discussion: full code  Family Communication: family was present at bedside, at the time of interview. The pt provided permission to discuss medical plan with  the family. Opportunity was given to ask question and all questions were answered satisfactorily.    Disposition:  Discharge to home. Expected discharge date: 12/03/2016, depending on the results of the surgical culture  Consultants: hand surgery Procedures: Irrigation and drainage of the abscess  Antibiotics: Anti-infectives    Start     Dose/Rate Route Frequency Ordered Stop   12/01/16 0300  vancomycin (VANCOCIN) IVPB 1000 mg/200 mL premix     1,000 mg 200 mL/hr over 60 Minutes Intravenous Every 12 hours 11/30/16 1635     11/30/16 2000  ceFEPIme (MAXIPIME) 1 g in dextrose 5 % 50 mL IVPB     1 g 100 mL/hr over 30 Minutes Intravenous Every 8 hours 11/30/16 1809     11/30/16 1900  metroNIDAZOLE (FLAGYL) IVPB 500 mg     500 mg 100 mL/hr over 60 Minutes Intravenous Every 8 hours 11/30/16 1801     11/30/16 1430  vancomycin (VANCOCIN) IVPB 1000 mg/200 mL premix     1,000 mg 200 mL/hr over 60 Minutes Intravenous  Once 11/30/16 1416 11/30/16 1530       Objective: Physical Exam: Vitals:   12/01/16 1052 12/01/16 1426 12/01/16 2128 12/02/16 0647  BP: 133/67 126/67 (!) 107/51 (!) 127/55  Pulse: 74 88 85 68  Resp:  16    Temp: 97.9 F (36.6 C) 99 F (37.2 C) 97.8 F (36.6 C) 97.4 F (36.3 C)  TempSrc: Oral  Oral Oral  SpO2: 100% 100% 100% 97%  Weight:      Height:        Intake/Output Summary (Last 24 hours) at 12/02/16 1221 Last data filed at 12/02/16 1053  Gross per 24 hour  Intake             1186 ml  Output              750 ml  Net              436 ml   Filed Weights   11/30/16 1306  Weight: 63.5 kg (140 lb)   General: Alert, Awake and Oriented to Time, Place and Person. Appear in mild distress, affect appropriate Eyes: PERRL, Conjunctiva normal ENT: Oral Mucosa clear moist. Neck: no JVD, no Abnormal Mass Or lumps Cardiovascular: S1 and S2 Present, no Murmur, Respiratory: Bilateral Air entry equal and Decreased, no use of accessory muscle, Clear to Auscultation, no Crackles, no wheezes Abdomen: Bowel Sound present, Soft and no tenderness Skin: no redness, no  Rash, no induration Extremities: no Pedal edema, no calf tenderness, Left upper extremity redness and swelling has improved significantly Neurologic: Grossly no focal neuro deficit. Bilaterally Equal motor strength  Data Reviewed: CBC:  Recent Labs Lab 11/30/16 1520 11/30/16 1924 12/02/16 0612  WBC 16.7* 11.3* 3.9*  NEUTROABS 13.1* 7.6 1.9  HGB 11.4* 10.2* 9.2*  HCT 31.6* 29.7* 27.3*  MCV 76.1* 77.3* 79.8  PLT 386 326 161   Basic Metabolic Panel:  Recent Labs Lab 11/30/16 1425 11/30/16 1924 12/02/16 0612  NA 129* 133* 139  K 3.2* 3.3* 3.5  CL 95* 101 111  CO2 25 24 21*  GLUCOSE 118* 68 95  BUN 8 5* 6  CREATININE 0.77 0.62 0.66  CALCIUM 8.4* 8.0* 7.7*  MG  --  1.6* 2.0    Liver Function Tests:  Recent Labs Lab 11/30/16 1924 12/02/16 0612  AST 36 46*  ALT 36 36  ALKPHOS 47 35*  BILITOT 0.4 0.4  PROT 6.9 5.3*  ALBUMIN 3.2*  2.2*   No results for input(s): LIPASE, AMYLASE in the last 168 hours. No results for input(s): AMMONIA in the last 168 hours. Coagulation Profile:  Recent Labs Lab 11/30/16 1924  INR 1.13   Cardiac Enzymes:  Recent Labs Lab 11/30/16 1924  CKTOTAL 378*   BNP (last 3 results) No results for input(s): PROBNP in the last 8760 hours. CBG: No results for input(s): GLUCAP in the last 168 hours. Studies: No results found.  Scheduled Meds: . ceFEPime (MAXIPIME) IV  1 g Intravenous Q8H  . cyclobenzaprine  5 mg Oral TID  . docusate sodium  100 mg Oral BID  . enoxaparin (LOVENOX) injection  40 mg Subcutaneous Q24H  . ketorolac  30 mg Intravenous Q6H  . metronidazole  500 mg Intravenous Q8H  . nicotine  21 mg Transdermal Daily  . sodium chloride  500 mL Intravenous Once  . vancomycin  1,000 mg Intravenous Q12H   Continuous Infusions: . sodium chloride    . lactated ringers Stopped (12/01/16 1100)   PRN Meds: acetaminophen **OR** acetaminophen, HYDROcodone-acetaminophen, hydrOXYzine, LORazepam, ondansetron **OR** ondansetron  (ZOFRAN) IV, polyethylene glycol  Time spent: 30 minutes  Author: Berle Mull, MD [facility] Pager: [phone] 12/02/2016 12:21 PM  If 7PM-7AM, please contact night-coverage at [email or web address], password [facility]

## 2016-12-03 LAB — AEROBIC CULTURE W GRAM STAIN (SUPERFICIAL SPECIMEN)

## 2016-12-03 LAB — AEROBIC CULTURE  (SUPERFICIAL SPECIMEN)

## 2016-12-03 MED ORDER — LEVOFLOXACIN IN D5W 750 MG/150ML IV SOLN
750.0000 mg | INTRAVENOUS | Status: DC
  Administered 2016-12-03 – 2016-12-04 (×2): 750 mg via INTRAVENOUS
  Filled 2016-12-03 (×2): qty 150

## 2016-12-03 MED ORDER — METRONIDAZOLE 500 MG PO TABS
500.0000 mg | ORAL_TABLET | Freq: Three times a day (TID) | ORAL | Status: DC
  Administered 2016-12-03 – 2016-12-04 (×4): 500 mg via ORAL
  Filled 2016-12-03 (×4): qty 1

## 2016-12-03 NOTE — Progress Notes (Signed)
[facility] Progress Note  Patient: Maria Abbott BLT:[id number]   PCP: No PCP Per Patient DOB: 12-Jul-1986   DOA: 11/30/2016   DOS: 12/03/2016   Date of Service: the patient was seen and examined on 12/03/2016  Subjective: Feeling better, pain has been well controlled Denies any diarrhea.Surgical incision has no evidence of discharge  Brief hospital course: Maria Abbott is a 31 y.o. female with Past medical history of active smoker, drug abuse. Pt injected IV Methamphetamine in her left hand, after that Patient started noticing left arm swelling for last 4 days progressively worsening. She noticed a swollen pustule, which she tried to drain with a pocket knife. Initially her hand swelling improved but later on it worsened with more redness as well as severe pain which is throbbing in nature. Pain is currently better but was 10 out of 10 on initiated. Because of that she decided to come to the hospital. Denies having any fever or chills no nausea no vomiting no rash anywhere else. No shortness of breath or chest pain. She also has history of left forearm plate placement for fracture when she was young. She smokes a pack a day, denies any alcohol abuse. Initially mentioned to the ER physician that she has used heroin. Denies any other drug abuse.  Assessment and Plan: 1. Cellulitis and abscess of hand Suspected compartment syndrome. Sepsis due to cellulitis. Suspected gas-forming infection based on the x-ray.   Patient was transferred from [facility] [place] for cellulitis and abscess of the hand. Has severe induration, limited mobility of the wrist joint, some numbness, serosanguineous discharge with foul-smelling, tachycardia and leukocytosis. She also has bluish black discoloration of the skin at that she has placed a Knife. concerned for possible compartment syndrome. Hand surgery has been consulted and patient underwent incision and debridement.  Will cover the patient  broadly with IV vancomycin, IV cefepime as well as IV Flagyl. Has penicillin allergies but tolerated cephalosporins without any issues. Recommend to elevate the left upper extremity. Follow blood cultures, Elevated ESR, CRP, CPK. Sepsis protocol was initiated in the ER, patient received 2 L of normal saline bolus, patient was given aggressive IV hydration, currently I will stop the fluids. Gram stain is showing abundant gram-negative rods, gram-positive cocci in pair, rare gram positive rods. Cultures are growing streptococcus group C, possible anaerobe is also present Hand surgery recommends washout wounds 3 times a day and follow up in office on a 10 days for wound check and suture removal. Discussed with infectious disease and will narrow her down to Levaquin and Flagyl.  2. Pain management. 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 Check UDS. In the past she was positive for opiates, cocaine, benzodiazepine, cannabis, she mentions she has used methamphetamine and heroin this time. Social worker consulted for assistance with drug abuse.  Pain management will be difficult,  I would continue the patient on scheduled Toradol 30 mg every 6 hours, scheduled Flexeril, when necessary Tylenol. Added oral Norco, avoid IV narcotics. PRN lorazepam for anxiety and withdrawal symptoms.  3. Hypokalemia, hyponatremia. Patient received IV normal saline bolus as well as potassium replacement in the ER. Currently resolved, monitor  4. Active smoker. Counseled for quitting smoking. Nicotine patch at present.  5. Mild elevation of beta-hCG not consistent with positive pregnancy. Check urine pregnancy test  6. Hepatitis C infection. HCV RNA is positive, discussed with patient. Will need  Outpatient therapy.  Bowel regimen: last BM 12/02/2016 Diet: regular diet DVT Prophylaxis: subcutaneous Heparin  Advance goals of  care discussion: full code  Family Communication: family was present at bedside, at the time of interview. The pt provided permission to discuss medical plan with the family. Opportunity was given to ask question and all questions were answered satisfactorily.   Disposition:  Discharge to home. Expected discharge date: 12/04/2016, depending on the results of the surgical culture  Consultants: hand surgery Procedures: Irrigation and drainage of the abscess  Antibiotics: Anti-infectives    Start     Dose/Rate Route Frequency Ordered Stop   12/03/16 1600  metroNIDAZOLE (FLAGYL) tablet 500 mg     500 mg Oral Every 8 hours 12/03/16 1514     12/03/16 1500  levofloxacin (LEVAQUIN) IVPB 750 mg     750 mg 100 mL/hr over 90 Minutes Intravenous Every 24 hours 12/03/16 1326     12/01/16 0300  vancomycin (VANCOCIN) IVPB 1000 mg/200 mL premix  Status:  Discontinued     1,000 mg 200 mL/hr over 60 Minutes Intravenous Every 12 hours 11/30/16 1635 12/03/16 1317   11/30/16 2000  ceFEPIme (MAXIPIME) 1 g in dextrose 5 % 50 mL IVPB  Status:  Discontinued     1 g 100 mL/hr over 30 Minutes Intravenous Every 8 hours 11/30/16 1809 12/03/16 1316   11/30/16 1900  metroNIDAZOLE (FLAGYL) IVPB 500 mg  Status:  Discontinued     500 mg 100 mL/hr over 60 Minutes Intravenous Every 8 hours 11/30/16 1801 12/03/16 1316   11/30/16 1430  vancomycin (VANCOCIN) IVPB 1000 mg/200 mL premix     1,000 mg 200 mL/hr over 60 Minutes Intravenous  Once 11/30/16 1416 11/30/16 1530       Objective: Physical Exam: Vitals:   12/02/16 1634 12/02/16 1900 12/03/16 0647 12/03/16 1500  BP: 90/70 91/64 137/78 (!) 96/55  Pulse: 82 68 69 71  Resp: 18 18  17   Temp: 97.9 F (36.6 C) 97.7 F (36.5 C) 97.7 F (36.5 C) 97.7 F (36.5 C)  TempSrc: Oral Oral Oral Oral  SpO2: 100% 100% 99% 99%  Weight:      Height:        Intake/Output Summary (Last 24 hours) at 12/03/16 1623 Last data filed at 12/03/16 1100  Gross per 24 hour    Intake              340 ml  Output                0 ml  Net              340 ml   Filed Weights   11/30/16 1306  Weight: 63.5 kg (140 lb)   General: Alert, Awake and Oriented to Time, Place and Person. Appear in mild distress, affect appropriate Eyes: PERRL, Conjunctiva normal ENT: Oral Mucosa clear moist. Neck: no JVD, no Abnormal Mass Or lumps Cardiovascular: S1 and S2 Present, no Murmur, Respiratory: Bilateral Air entry equal and Decreased, no use of accessory muscle, Clear to Auscultation, no Crackles, no wheezes Abdomen: Bowel Sound present, Soft and no tenderness Skin: no redness, no Rash, no induration Extremities: no Pedal edema, no calf tenderness, Left upper extremity redness and swelling has improved significantly Neurologic: Grossly no focal neuro deficit. Bilaterally Equal motor strength  Data Reviewed: CBC:  Recent Labs Lab 11/30/16 1520 11/30/16 1924 12/02/16 0612  WBC 16.7* 11.3* 3.9*  NEUTROABS 13.1* 7.6 1.9  HGB 11.4* 10.2* 9.2*  HCT 31.6* 29.7* 27.3*  MCV 76.1* 77.3* 79.8  PLT 386 326 592   Basic Metabolic Panel:  Recent Labs Lab 11/30/16 1425 11/30/16 1924 12/02/16 0612  NA 129* 133* 139  K 3.2* 3.3* 3.5  CL 95* 101 111  CO2 25 24 21*  GLUCOSE 118* 68 95  BUN 8 5* 6  CREATININE 0.77 0.62 0.66  CALCIUM 8.4* 8.0* 7.7*  MG  --  1.6* 2.0    Liver Function Tests:  Recent Labs Lab 11/30/16 1924 12/02/16 0612  AST 36 46*  ALT 36 36  ALKPHOS 47 35*  BILITOT 0.4 0.4  PROT 6.9 5.3*  ALBUMIN 3.2* 2.2*   No results for input(s): LIPASE, AMYLASE in the last 168 hours. No results for input(s): AMMONIA in the last 168 hours. Coagulation Profile:  Recent Labs Lab 11/30/16 1924  INR 1.13   Cardiac Enzymes:  Recent Labs Lab 11/30/16 1924  CKTOTAL 378*   BNP (last 3 results) No results for input(s): PROBNP in the last 8760 hours. CBG: No results for input(s): GLUCAP in the last 168 hours. Studies: No results found.  Scheduled  Meds: . cyclobenzaprine  5 mg Oral TID  . docusate sodium  100 mg Oral BID  . enoxaparin (LOVENOX) injection  40 mg Subcutaneous Q24H  . ketorolac  30 mg Intravenous Q6H  . levofloxacin (LEVAQUIN) IV  750 mg Intravenous Q24H  . metroNIDAZOLE  500 mg Oral Q8H  . nicotine  21 mg Transdermal Daily  . sodium chloride  500 mL Intravenous Once   Continuous Infusions: . sodium chloride    . lactated ringers Stopped (12/01/16 1100)   PRN Meds: acetaminophen **OR** acetaminophen, HYDROcodone-acetaminophen, hydrOXYzine, LORazepam, ondansetron **OR** ondansetron (ZOFRAN) IV, polyethylene glycol  Time spent: 30 minutes  Author: Berle Mull, MD [facility] Pager: [phone] 12/03/2016 4:23 PM  If 7PM-7AM, please contact night-coverage at [email or web address], password [facility]

## 2016-12-03 NOTE — Care Management Note (Signed)
Case Management Note  Patient Details  Name: Maria ChimesMelissa Gladman MRN: [id number] Date of Birth: 02-11-1986  Subjective/Objective:   Pt admitted on 11/30/16 with cellulitis and abscess of Lt hand with sepsis.  PTA, pt independent of ADLS.                 Action/Plan: Will follow for discharge planning as pt progresses.  CSW to follow for PSA counseling.    Expected Discharge Date:                  Expected Discharge Plan:  Home w Home Health Services  In-House Referral:     Discharge planning Services  CM Consult  Post Acute Care Choice:    Choice offered to:     DME Arranged:    DME Agency:     HH Arranged:    HH Agency:     Status of Service:  In process, will continue to follow  If discussed at Long Length of Stay Meetings, dates discussed:    Additional Comments:  Glennon Macmerson, Blanca Carreon M, RN 12/03/2016, 5:03 PM

## 2016-12-03 NOTE — Progress Notes (Signed)
Pharmacy Antibiotic Note  Maria Abbott is a 31 y.o. female admitted on 11/30/2016 with cellulitis.   Now transitioning to Levaquin  Plan: Levaquin 750 mg iv Q 24 hours   Height: 5' 2.5" (158.8 cm) Weight: 140 lb (63.5 kg) IBW/kg (Calculated) : 51.25  Temp (24hrs), Avg:97.8 F (36.6 C), Min:97.7 F (36.5 C), Max:97.9 F (36.6 C)   Recent Labs Lab 11/30/16 1425 11/30/16 1520 11/30/16 1924 11/30/16 2222 12/01/16 1530 12/02/16 0612  WBC  --  16.7* 11.3*  --   --  3.9*  CREATININE 0.77  --  0.62  --   --  0.66  LATICACIDVEN  --   --  1.0 2.4* 0.8  --     Estimated Creatinine Clearance: 89.6 mL/min (by C-G formula based on SCr of 0.66 mg/dL).    Allergies  Allergen Reactions  . Codeine Hives and Swelling  . Penicillins Hives and Swelling  . Sulfa Antibiotics Hives and Swelling   Thank you Okey RegalLisa Yasir Kitner, PharmD  12/03/2016 1:27 PM  ______________________________________________

## 2016-12-04 ENCOUNTER — Encounter (HOSPITAL_COMMUNITY): Payer: Self-pay | Admitting: General Surgery

## 2016-12-04 MED ORDER — DOCUSATE SODIUM 100 MG PO CAPS: 100.0000 mg | ORAL_CAPSULE | Freq: Two times a day (BID) | ORAL | 0 refills | Status: AC

## 2016-12-04 MED ORDER — IBUPROFEN 600 MG PO TABS: 600.0000 mg | ORAL_TABLET | Freq: Three times a day (TID) | ORAL | 0 refills | Status: AC

## 2016-12-04 MED ORDER — NICOTINE 21 MG/24HR TD PT24: 21.0000 mg | MEDICATED_PATCH | Freq: Every day | TRANSDERMAL | 0 refills | Status: AC

## 2016-12-04 MED ORDER — TRAMADOL-ACETAMINOPHEN 37.5-325 MG PO TABS: 1.0000 | ORAL_TABLET | Freq: Four times a day (QID) | ORAL | 0 refills | Status: AC | PRN

## 2016-12-04 MED ORDER — CYCLOBENZAPRINE HCL 5 MG PO TABS: 5.0000 mg | ORAL_TABLET | Freq: Three times a day (TID) | ORAL | 0 refills | Status: AC

## 2016-12-04 MED ORDER — LEVOFLOXACIN 750 MG PO TABS: 750.0000 mg | ORAL_TABLET | Freq: Every day | ORAL | 0 refills | Status: AC

## 2016-12-04 MED ORDER — METRONIDAZOLE 500 MG PO TABS: 500.0000 mg | ORAL_TABLET | Freq: Three times a day (TID) | ORAL | 0 refills | Status: AC

## 2016-12-04 MED FILL — levoFLOXacin 750 MG TABS: 750 | 11 days supply | Qty: 11 | Fill #0

## 2016-12-04 MED FILL — metroNIDAZOLE 500 MG TABS: 500 | 11 days supply | Qty: 33 | Fill #0

## 2016-12-04 MED FILL — CYCLOBENZAPRINE 5 MG TABLET: 5 | 5 days supply | Qty: 15 | Fill #0

## 2016-12-04 MED FILL — DOK 100 MG SOFTGEL: 100 | 50 days supply | Qty: 100 | Fill #0

## 2016-12-04 MED FILL — NICOTINE 21 MG/24HR PATCH: 21 | 28 days supply | Qty: 28 | Fill #0

## 2016-12-04 NOTE — Progress Notes (Signed)
Patient ready for discharge. All instructions have been reviewed and questions answered.IV has been removed, dressing changed to wound site. Patient verbalizes understanding of how to change dressing and also how often. Medications and when they are due has been reviewed in detail as well reinforcing smoking and drug use cessation. Waiting on transportation home.

## 2016-12-04 NOTE — Care Management Note (Signed)
Case Management Note  Patient Details  Name: Maria Abbott MRN: [id number] Date of Birth: 1986/08/31  Subjective/Objective:      31 yr old female admitted with left hand cellulitis.              Action/Plan:  Per Dr. Eliane DecreePatel's order, Case manager schedule [facility]. Patient will See Delle ReiningLea Colter,NP on Monday April 2,2018 at 8:45am/ This information has been added to patient's discharge instructions.    Expected Discharge Date:  12/04/16               Expected Discharge Plan:  Home/Self Care  In-House Referral:  NA  Discharge planning Services  CM Consult, Follow-up appt scheduled  Post Acute Care Choice:  NA Choice offered to:  NA  DME Arranged:    DME Agency:  NA  HH Arranged:  NA HH Agency:     Status of Service:  Completed, signed off  If discussed at Long Length of Stay Meetings, dates discussed:    Additional Comments:  Durenda GuthrieBrady, Travus Oren Naomi, RN 12/04/2016, 3:54 PM

## 2016-12-04 NOTE — Progress Notes (Signed)
Patient was assisted with a cab voucher home since patient's family was unable to assist her with transportation. CSW signing off as patient has no more needs.  Marrianne MoodAshley Alvar Malinoski, MSW,  [employer] [phone]

## 2016-12-05 LAB — CULTURE, BLOOD (ROUTINE X 2)
Culture: NO GROWTH
Culture: NO GROWTH

## 2016-12-06 LAB — AEROBIC/ANAEROBIC CULTURE W GRAM STAIN (SURGICAL/DEEP WOUND)

## 2016-12-06 LAB — AEROBIC/ANAEROBIC CULTURE (SURGICAL/DEEP WOUND)

## 2016-12-07 NOTE — Discharge Summary (Signed)
[facility] Discharge Summary   Patient: Maria Abbott VVZ:[id number]   PCP: No PCP Per Patient DOB: [birth year]   Date of admission: 11/30/2016   Date of discharge: 12/04/2016    Discharge Diagnoses:  Principal Problem:   Cellulitis and abscess of hand Active Problems:   Heroin abuse   Hypokalemia   Hyponatremia   Smoker   Sepsis affecting skin ([place])   Admitted From: home Disposition:  home  Recommendations for Outpatient Follow-up:  1. Please follow up with hand surgery in 1 week   [place], MD Follow up in 1 week(s).   Specialty:  General Surgery Contact information: [address] [place] [address] [phone]        [place]              Follow up.   Why:  you have an appointment scheduled with Vista Lawman at 8:45am on Monday, December 17, 2016 Contact information: [place] [place] [phone]         Diet recommendation: regular diet  Activity: The patient is advised to gradually reintroduce usual activities.  Discharge Condition: good  Code Status: full code  History of present illness: As per the H and P dictated on admission, "Maria Abbott is a 31 y.o. female with Past medical history of active smoker, drug abuse. He shouldn't injected IV Methamphetamine in her left hand, after that Patient started noticing left arm swelling for last 4 days progressively worsening. She noticed a swollen pustule, which she tried to drain with a pocket knife. Initially her hand swelling improved but later on it worsened with more redness as well as severe pain which is throbbing in nature. Pain is currently better but was 10 out of 10 on initiated. Because of that she decided to come to the hospital. Denies having any fever or chills no nausea no vomiting no rash anywhere else. No shortness of breath or chest pain. She also has  history of left forearm plate placement for fracture when she was young. She smokes a pack a day, denies any alcohol abuse. Initially mentioned to the ER physician that she has used heroin. Denies any other drug abuse."  Hospital Course:  Summary of her active problems in the hospital is as following. 1. Cellulitis and abscess of hand Suspected compartment syndrome. Sepsis due to cellulitis. Suspected gas-forming infection based on the x-ray.   Patient was transferred from [facility] for cellulitis and abscess of the hand. Has severe induration, limited mobility of the wrist joint, some numbness, serosanguineous discharge with foul-smelling, tachycardia and leukocytosis. She also has bluish black discoloration of the skin at that she has placed a Knife. concerned for possible compartment syndrome. Hand surgery has been consulted and patient underwent incision and debridement.  Started broadly with IV vancomycin, IV cefepime as well as IV Flagyl. Has penicillin allergies but tolerated cephalosporins without any issues. Recommend to elevate the left upper extremity. Negative blood cultures, Elevated ESR, CRP, CPK. Sepsis protocol was initiated in the ER, patient received 2 L of normal saline bolus, patient was given aggressive IV hydration, Gram stain is showing abundant gram-negative rods, gram-positive cocci in pair, rare gram positive rods. Cultures are growing streptococcus group C, possible anaerobe is also present Hand surgery recommends washout wounds 3 times a day and follow up in office on a 10 days for wound check and suture  removal. Discussed with infectious disease and will narrow her down to Levaquin and Flagyl.  2.Pain management. Methamphetamine and Heroin abuse. Due to the patient's history of IV drug abuse  Checked HIV hep C as well as hep B. Check UDS. In the past she was positive for opiates, cocaine, benzodiazepine, cannabis, she mentions she has used  methamphetamine and heroin this time. Social worker consulted for assistance with drug abuse.  Pain management will be difficult. Continue flexeril, ultracet, ibuprofen.  3. Hypokalemia, hyponatremia. Patient received IV normal saline bolus as well as potassium replacement in the ER. Currently resolved, monitor  4.Active smoker. Counseled for quitting smoking. Nicotine patch at present.  5. Mild elevation of beta-hCG not consistent with positive pregnancy. Negative urine pregnancy test  6. Hepatitis C infection. HCV RNA is positive, discussed with patient. Will need Outpatient therapy.  All other chronic medical condition were stable during the hospitalization.  Patient was ambulatory without any assistance. On the day of the discharge the patient's vitals were stable, and no other acute medical condition were reported by patient. the patient was felt safe to be discharge at home with family.  Procedures and Results:  Irrigation and drainage of the abscess   Consultations:  Hand surgery  DISCHARGE MEDICATION: Discharge Medication List as of 12/04/2016  2:25 PM    START taking these medications   Details  cyclobenzaprine (FLEXERIL) 5 MG tablet Take 1 tablet (5 mg total) by mouth 3 (three) times daily., Starting Tue 12/04/2016, Normal    docusate sodium (COLACE) 100 MG capsule Take 1 capsule (100 mg total) by mouth 2 (two) times daily., Starting Tue 12/04/2016, Normal    levofloxacin (LEVAQUIN) 750 MG tablet Take 1 tablet (750 mg total) by mouth daily., Starting Tue 12/04/2016, Until Sat 12/15/2016, Normal    metroNIDAZOLE (FLAGYL) 500 MG tablet Take 1 tablet (500 mg total) by mouth every 8 (eight) hours., Starting Tue 12/04/2016, Until Sat 12/15/2016, Normal    nicotine (NICODERM CQ - DOSED IN MG/24 HOURS) 21 mg/24hr patch Place 1 patch (21 mg total) onto the skin daily., Starting Wed 12/05/2016, Normal    traMADol-acetaminophen (ULTRACET) 37.5-325 MG tablet Take 1 tablet  by mouth every 6 (six) hours as needed., Starting Tue 12/04/2016, Print      CONTINUE these medications which have CHANGED   Details  ibuprofen (ADVIL,MOTRIN) 600 MG tablet Take 1 tablet (600 mg total) by mouth 3 (three) times daily., Starting Tue 12/04/2016, Until Sat 12/08/2016, Normal      CONTINUE these medications which have NOT CHANGED   Details  AZO-CRANBERRY PO Take 2 tablets by mouth 3 (three) times daily as needed. For pain, Until Discontinued, Historical Med       Allergies  Allergen Reactions  . Codeine Hives and Swelling  . Penicillins Hives and Swelling  . Sulfa Antibiotics Hives and Swelling   Discharge Instructions    Ambulatory referral to Wound Clinic    Complete by:  As directed    Diet - low sodium heart healthy    Complete by:  As directed    Discharge instructions    Complete by:  As directed    It is important that you read following instructions as well as go over your medication list with RN to help you understand your care after this hospitalization.  Discharge Instructions: Please follow-up with PCP in one week  Please request your primary care physician to go over all Hospital Tests and Procedure/Radiological results at the follow up,  Please  get all Hospital records sent to your PCP by signing hospital release before you go home.   Do not drive, operating heavy machinery, perform activities at heights, swimming or participation in water activities or provide baby sitting services while you are on Pain, Sleep and Anxiety Medications; until you have been seen by Primary Care Physician or a Neurologist and advised to do so again. Do not take more than prescribed Pain, Sleep and Anxiety Medications. You were cared for by a hospitalist during your hospital stay. If you have any questions about your discharge medications or the care you received while you were in the hospital after you are discharged, you can call the unit and ask to speak with the hospitalist  on call if the hospitalist that took care of you is not available.  Once you are discharged, your primary care physician will handle any further medical issues. Please note that NO REFILLS for any discharge medications will be authorized once you are discharged, as it is imperative that you return to your primary care physician (or establish a relationship with a primary care physician if you do not have one) for your aftercare needs so that they can reassess your need for medications and monitor your lab values. You Must read complete instructions/literature along with all the possible adverse reactions/side effects for all the Medicines you take and that have been prescribed to you. Take any new Medicines after you have completely understood and accept all the possible adverse reactions/side effects. Wear Seat belts while driving. If you have smoked or chewed Tobacco in the last 2 yrs please stop smoking and/or stop any Recreational drug use.   Discharge wound care:    Complete by:  As directed    wash out wounds TID, f/u in office in 7-10 days for wound check, suture removal.   Increase activity slowly    Complete by:  As directed      Discharge Exam: Filed Weights   11/30/16 1306  Weight: 63.5 kg (140 lb)   Vitals:   12/04/16 0650 12/04/16 1223  BP: (!) 141/88 104/75  Pulse: 81 85  Resp:    Temp:  98.5 F (36.9 C)   General: Appear in no distress, no Rash; Oral Mucosa moist. Cardiovascular: S1 and S2 Present, no Murmur, no JVD Respiratory: Bilateral Air entry present and Clear to Auscultation, no Crackles, no wheezes Abdomen: Bowel Sound present, Soft and no tenderness Extremities: no Pedal edema, no calf tenderness Neurology: Grossly no focal neuro deficit.  The results of significant diagnostics from this hospitalization (including imaging, microbiology, ancillary and laboratory) are listed below for reference.    Significant Diagnostic Studies: Dg Forearm Left  Result  Date: 11/30/2016 CLINICAL DATA:  Cellulitis of the left hand and wrist EXAM: LEFT FOREARM - 2 VIEW COMPARISON:  None. FINDINGS: Surgical plate and screw fixation of the distal ulna with grossly intact hardware. Diffuse subcutaneous edema with multiple pockets of soft tissue gas along the distal dorsal forearm and wrist. No fracture or periostitis. No radiopaque foreign body. IMPRESSION: 1. Surgical plate and screw fixation of the distal ulna. No acute osseous abnormality 2. Diffuse soft tissue swelling with soft tissue gas present along the dorsal aspect of the distal forearm and wrist. Electronically Signed   By: Donavan Foil M.D.   On: 11/30/2016 20:54   Dg Hand Complete Left  Result Date: 11/30/2016 CLINICAL DATA:  Pt has cellulitis of the left hand, wrist, and forearm. Pt had open wound on the  posterior aspect of the wrist, that was oozing with strong odor. Previous sx x 8 yrs ago. Best obtainable images due to limited range of motion because of inflammation. EXAM: LEFT HAND - COMPLETE 3+ VIEW COMPARISON:  None. FINDINGS: Subcutaneous gas in the lateral ulnar aspect of the rib extending from the carpal metacarpal level to the distal ulnar diaphysis. Gas extends over an 8 cm length in a linear distribution. Dynamic fixation plate of the distal ulna. IMPRESSION: Subcutaneous gas in the dorsum of the LEFT wrist along the ulnar side spanning the radiocarpal joint. Findings consistent with gas forming bacterial infection. Dynamic fixation plate along the distal ulna. These results will be called to the ordering clinician or representative by the Radiologist Assistant, and communication documented in the PACS or zVision Dashboard. Electronically Signed   By: Suzy Bouchard M.D.   On: 11/30/2016 20:53    Microbiology: Recent Results (from the past 240 hour(s))  Wound or Superficial Culture     Status: None   Collection Time: 11/30/16  3:15 PM  Result Value Ref Range Status   Specimen Description WOUND  LEFT HAND  Final   Special Requests NONE  Final   Gram Stain   Final    MODERATE WBC PRESENT, PREDOMINANTLY PMN MODERATE GRAM NEGATIVE RODS MODERATE GRAM POSITIVE COCCI IN PAIRS FEW GRAM POSITIVE RODS Performed at [facility], [place] [address], [place], [place] [address]    Culture   Final    FEW STREPTOCOCCUS GROUP C FEW STREPTOCOCCUS GROUP F    Report Status 12/03/2016 FINAL  Final  Culture, blood (x 2)     Status: None   Collection Time: 11/30/16  7:23 PM  Result Value Ref Range Status   Specimen Description BLOOD BLOOD RIGHT HAND  Final   Special Requests BOTTLES DRAWN AEROBIC AND ANAEROBIC 5CC  Final   Culture NO GROWTH 5 DAYS  Final   Report Status 12/05/2016 FINAL  Final  Culture, blood (x 2)     Status: None   Collection Time: 11/30/16  7:38 PM  Result Value Ref Range Status   Specimen Description BLOOD RIGHT ANTECUBITAL  Final   Special Requests BOTTLES DRAWN AEROBIC AND ANAEROBIC 10CC  Final   Culture NO GROWTH 5 DAYS  Final   Report Status 12/05/2016 FINAL  Final  MRSA PCR Screening     Status: None   Collection Time: 11/30/16  9:45 PM  Result Value Ref Range Status   MRSA by PCR NEGATIVE NEGATIVE Final    Comment:        The GeneXpert MRSA Assay (FDA approved for NASAL specimens only), is one component of a comprehensive MRSA colonization surveillance program. It is not intended to diagnose MRSA infection nor to guide or monitor treatment for MRSA infections.   Aerobic/Anaerobic Culture (surgical/deep wound)     Status: None   Collection Time: 12/01/16  9:52 AM  Result Value Ref Range Status   Specimen Description ABSCESS LEFT FOREARM  Final   Special Requests PATIENT ON FOLLOWING  VANCOMYCIN  Final   Gram Stain   Final    MODERATE WBC PRESENT, PREDOMINANTLY PMN ABUNDANT GRAM NEGATIVE RODS MODERATE GRAM POSITIVE COCCI IN PAIRS RARE GRAM POSITIVE RODS    Culture   Final    ABUNDANT STREPTOCOCCUS GROUP C MIXED ANAEROBIC FLORA PRESENT.  CALL LAB  IF FURTHER IID REQUIRED.    Report Status 12/06/2016 FINAL  Final     Labs: CBC:  Recent Labs Lab 11/30/16 1520  11/30/16 1924 12/02/16 0612  WBC 16.7* 11.3* 3.9*  NEUTROABS 13.1* 7.6 1.9  HGB 11.4* 10.2* 9.2*  HCT 31.6* 29.7* 27.3*  MCV 76.1* 77.3* 79.8  PLT 386 326 166   Basic Metabolic Panel:  Recent Labs Lab 11/30/16 1425 11/30/16 1924 12/02/16 0612  NA 129* 133* 139  K 3.2* 3.3* 3.5  CL 95* 101 111  CO2 25 24 21*  GLUCOSE 118* 68 95  BUN 8 5* 6  CREATININE 0.77 0.62 0.66  CALCIUM 8.4* 8.0* 7.7*  MG  --  1.6* 2.0   Liver Function Tests:  Recent Labs Lab 11/30/16 1924 12/02/16 0612  AST 36 46*  ALT 36 36  ALKPHOS 47 35*  BILITOT 0.4 0.4  PROT 6.9 5.3*  ALBUMIN 3.2* 2.2*   No results for input(s): LIPASE, AMYLASE in the last 168 hours. No results for input(s): AMMONIA in the last 168 hours. Cardiac Enzymes:  Recent Labs Lab 11/30/16 1924  CKTOTAL 378*   BNP (last 3 results) No results for input(s): BNP in the last 8760 hours. CBG: No results for input(s): GLUCAP in the last 168 hours. Time spent: 35 minutes  Signed:  Berle Mull  [facility] 12/04/2016 , 12:11 PM

## 2016-12-17 ENCOUNTER — Encounter (HOSPITAL_BASED_OUTPATIENT_CLINIC_OR_DEPARTMENT_OTHER): Payer: Medicaid Other

## 2017-02-16 NOTE — Addendum Note (Signed)
Addendum  created 02/16/17 1042 by Heather RobertsSinger, Tonetta Napoles, MD   Sign clinical note

## 2017-08-12 ENCOUNTER — Emergency Department: Payer: Medicaid Other

## 2017-08-12 ENCOUNTER — Other Ambulatory Visit: Payer: Self-pay

## 2017-08-12 ENCOUNTER — Encounter: Payer: Self-pay | Admitting: Emergency Medicine

## 2017-08-12 ENCOUNTER — Emergency Department
Admission: EM | Admit: 2017-08-12 | Discharge: 2017-08-12 | Disposition: A | Discharge status: Home / Self Care | Payer: Medicaid Other | Attending: Emergency Medicine | Admitting: Emergency Medicine

## 2017-08-12 DIAGNOSIS — Z79899 Other long term (current) drug therapy: Secondary | ICD-10-CM | POA: Insufficient documentation

## 2017-08-12 DIAGNOSIS — R52 Pain, unspecified: Secondary | ICD-10-CM

## 2017-08-12 DIAGNOSIS — R1011 Right upper quadrant pain: Secondary | ICD-10-CM | POA: Insufficient documentation

## 2017-08-12 DIAGNOSIS — F1120 Opioid dependence, uncomplicated: Secondary | ICD-10-CM

## 2017-08-12 DIAGNOSIS — F17210 Nicotine dependence, cigarettes, uncomplicated: Secondary | ICD-10-CM | POA: Insufficient documentation

## 2017-08-12 DIAGNOSIS — K8050 Calculus of bile duct without cholangitis or cholecystitis without obstruction: Secondary | ICD-10-CM | POA: Diagnosis not present

## 2017-08-12 LAB — CBC
HCT: 30.4 % — ABNORMAL LOW (ref 35.0–47.0)
Hemoglobin: 10.5 g/dL — ABNORMAL LOW (ref 12.0–16.0)
MCH: 26.5 pg (ref 26.0–34.0)
MCHC: 34.6 g/dL (ref 32.0–36.0)
MCV: 76.5 fL — ABNORMAL LOW (ref 80.0–100.0)
PLATELETS: 595 10*3/uL — AB (ref 150–440)
RBC: 3.97 MIL/uL (ref 3.80–5.20)
RDW: 14.2 % (ref 11.5–14.5)
WBC: 8.3 10*3/uL (ref 3.6–11.0)

## 2017-08-12 LAB — URINALYSIS, COMPLETE (UACMP) WITH MICROSCOPIC
Bacteria, UA: NONE SEEN
Bilirubin Urine: NEGATIVE
GLUCOSE, UA: NEGATIVE mg/dL
Ketones, ur: NEGATIVE mg/dL
Leukocytes, UA: NEGATIVE
Nitrite: NEGATIVE
PH: 7 (ref 5.0–8.0)
Protein, ur: NEGATIVE mg/dL
RBC / HPF: NONE SEEN RBC/hpf (ref 0–5)
SPECIFIC GRAVITY, URINE: 1.001 — AB (ref 1.005–1.030)
WBC, UA: NONE SEEN WBC/hpf (ref 0–5)

## 2017-08-12 LAB — COMPREHENSIVE METABOLIC PANEL
ALBUMIN: 2.8 g/dL — AB (ref 3.5–5.0)
ALK PHOS: 68 U/L (ref 38–126)
ALT: 15 U/L (ref 14–54)
AST: 26 U/L (ref 15–41)
Anion gap: 9 (ref 5–15)
BILIRUBIN TOTAL: 0.3 mg/dL (ref 0.3–1.2)
BUN: <5 mg/dL — ABNORMAL LOW (ref 6–20)
CALCIUM: 8.6 mg/dL — AB (ref 8.9–10.3)
CO2: 27 mmol/L (ref 22–32)
CREATININE: 0.64 mg/dL (ref 0.44–1.00)
Chloride: 96 mmol/L — ABNORMAL LOW (ref 101–111)
GFR calc Af Amer: >60 mL/min (ref >60)
GFR calc non Af Amer: >60 mL/min (ref >60)
GLUCOSE: 89 mg/dL (ref 65–99)
Potassium: 3.9 mmol/L (ref 3.5–5.1)
SODIUM: 132 mmol/L — AB (ref 135–145)
TOTAL PROTEIN: 8.2 g/dL — AB (ref 6.5–8.1)

## 2017-08-12 LAB — LIPASE, BLOOD: Lipase: 19 U/L (ref 11–51)

## 2017-08-12 MED ORDER — ONDANSETRON HCL 4 MG/2ML IJ SOLN
4.0000 mg | Freq: Once | INTRAMUSCULAR | Status: AC
  Administered 2017-08-12: 4 mg via INTRAVENOUS
  Filled 2017-08-12: qty 2

## 2017-08-12 MED ORDER — KETOROLAC TROMETHAMINE 30 MG/ML IJ SOLN
15.0000 mg | Freq: Once | INTRAMUSCULAR | Status: AC
  Administered 2017-08-12: 15 mg via INTRAVENOUS
  Filled 2017-08-12: qty 1

## 2017-08-12 MED ORDER — IBUPROFEN 600 MG PO TABS: 600.0000 mg | ORAL_TABLET | Freq: Three times a day (TID) | ORAL | 0 refills | Status: AC | PRN

## 2017-08-12 MED ORDER — SODIUM CHLORIDE 0.9 % IV BOLUS (SEPSIS)
1000.0000 mL | Freq: Once | INTRAVENOUS | Status: AC
  Administered 2017-08-12: 1000 mL via INTRAVENOUS

## 2017-08-12 MED ORDER — NALOXONE HCL 4 MG/0.1ML NA LIQD
1.0000 | Freq: Once | NASAL | Status: DC
  Filled 2017-08-12: qty 4

## 2017-08-12 MED ORDER — DICYCLOMINE HCL 20 MG PO TABS: 20.0000 mg | ORAL_TABLET | Freq: Three times a day (TID) | ORAL | 0 refills | Status: AC | PRN

## 2017-08-12 MED ORDER — DICYCLOMINE HCL 10 MG PO CAPS
20.0000 mg | ORAL_CAPSULE | Freq: Once | ORAL | Status: AC
  Administered 2017-08-12: 20 mg via ORAL
  Filled 2017-08-12: qty 2

## 2017-08-12 MED ORDER — MORPHINE SULFATE (PF) 4 MG/ML IV SOLN
8.0000 mg | Freq: Once | INTRAVENOUS | Status: AC
  Administered 2017-08-12: 8 mg via INTRAVENOUS
  Filled 2017-08-12: qty 2

## 2017-08-12 MED ORDER — HYDROCODONE-ACETAMINOPHEN 5-325 MG PO TABS: 1.0000 | ORAL_TABLET | Freq: Four times a day (QID) | ORAL | 0 refills | Status: AC | PRN

## 2017-08-12 MED ORDER — ONDANSETRON 4 MG PO TBDP
4.0000 mg | ORAL_TABLET | Freq: Three times a day (TID) | ORAL | 0 refills | Status: DC | PRN
Start: 2017-08-12 — End: 2019-07-15

## 2017-08-12 NOTE — ED Triage Notes (Signed)
Pt reports right flank and RLQ abdominal pain for one week. Pt reports some pain with  urination. Denies NVD. No apparent distress in triage.

## 2017-08-12 NOTE — Discharge Instructions (Addendum)
Please follow-up at residential treatment services tomorrow for help stopping using heroin.  Please also make an appointment to follow-up with the general surgeon for recheck of your gallbladder.  Return to the emergency department for any concerns whatsoever.  It was a pleasure to take care of you today, and thank you for coming to our emergency department.  If you have any questions or concerns before leaving please ask the nurse to grab me and I'm more than happy to go through your aftercare instructions again.  If you were prescribed any opioid pain medication today such as Norco, Vicodin, Percocet, morphine, hydrocodone, or oxycodone please make sure you do not drive when you are taking this medication as it can alter your ability to drive safely.  If you have any concerns once you are home that you are not improving or are in fact getting worse before you can make it to your follow-up appointment, please do not hesitate to call 911 and come back for further evaluation.  Merrily BrittleNeil Shay Bartoli, MD  Results for orders placed or performed during the hospital encounter of 08/12/17  Lipase, blood  Result Value Ref Range   Lipase 19 11 - 51 U/L  Comprehensive metabolic panel  Result Value Ref Range   Sodium 132 (L) 135 - 145 mmol/L   Potassium 3.9 3.5 - 5.1 mmol/L   Chloride 96 (L) 101 - 111 mmol/L   CO2 27 22 - 32 mmol/L   Glucose, Bld 89 65 - 99 mg/dL   BUN <5 (L) 6 - 20 mg/dL   Creatinine, Ser 6.600.64 0.44 - 1.00 mg/dL   Calcium 8.6 (L) 8.9 - 10.3 mg/dL   Total Protein 8.2 (H) 6.5 - 8.1 g/dL   Albumin 2.8 (L) 3.5 - 5.0 g/dL   AST 26 15 - 41 U/L   ALT 15 14 - 54 U/L   Alkaline Phosphatase 68 38 - 126 U/L   Total Bilirubin 0.3 0.3 - 1.2 mg/dL   GFR calc non Af Amer >60 >60 mL/min   GFR calc Af Amer >60 >60 mL/min   Anion gap 9 5 - 15  CBC  Result Value Ref Range   WBC 8.3 3.6 - 11.0 K/uL   RBC 3.97 3.80 - 5.20 MIL/uL   Hemoglobin 10.5 (L) 12.0 - 16.0 g/dL   HCT 63.030.4 (L) 16.035.0 - 10.947.0 %   MCV 76.5 (L) 80.0 - 100.0 fL   MCH 26.5 26.0 - 34.0 pg   MCHC 34.6 32.0 - 36.0 g/dL   RDW 32.314.2 55.711.5 - 32.214.5 %   Platelets 595 (H) 150 - 440 K/uL  Urinalysis, Complete w Microscopic  Result Value Ref Range   Color, Urine STRAW (A) YELLOW   APPearance CLEAR (A) CLEAR   Specific Gravity, Urine 1.001 (L) 1.005 - 1.030   pH 7.0 5.0 - 8.0   Glucose, UA NEGATIVE NEGATIVE mg/dL   Hgb urine dipstick MODERATE (A) NEGATIVE   Bilirubin Urine NEGATIVE NEGATIVE   Ketones, ur NEGATIVE NEGATIVE mg/dL   Protein, ur NEGATIVE NEGATIVE mg/dL   Nitrite NEGATIVE NEGATIVE   Leukocytes, UA NEGATIVE NEGATIVE   RBC / HPF NONE SEEN 0 - 5 RBC/hpf   WBC, UA NONE SEEN 0 - 5 WBC/hpf   Bacteria, UA NONE SEEN NONE SEEN   Squamous Epithelial / LPF 0-5 (A) NONE SEEN   [place] Abdomen Limited Ruq  Result Date: 08/12/2017 CLINICAL DATA:  Right upper quadrant pain for 1 week EXAM: ULTRASOUND ABDOMEN LIMITED RIGHT UPPER QUADRANT COMPARISON:  None. FINDINGS: Gallbladder: Filled with shadowing stones measuring up to 1 cm. Negative sonographic Murphy. Normal wall thickness. Common bile duct: Diameter: 5 mm maximum. Liver: Slight increased hepatic echogenicity. Portal vein is patent on color Doppler imaging with normal direction of blood flow towards the liver. IMPRESSION: 1. Cholelithiasis without definitive sonographic evidence for acute cholecystitis 2. No biliary dilatation Electronically Signed   By: Jasmine PangKim  Fujinaga M.D.   On: 08/12/2017 17:55

## 2017-08-12 NOTE — ED Provider Notes (Signed)
[facility] Emergency Department Provider Note  ____________________________________________   First MD Initiated Contact with Patient 08/12/17 1652     (approximate)  I have reviewed the triage vital signs and the nursing notes.   HISTORY  Chief Complaint Abdominal Pain   HPI Maria Abbott is a 31 y.o. female is self presents to the emergency department with roughly 1 week of intermittent severe right-sided abdominal pain.  The pain is associated with nausea but no vomiting.  She has no history of abdominal surgeries.  She has no fevers or chills.  The pain is somewhat worsened by eating and somewhat improved when not eating.  The patient is an intravenous heroin user and last used heroin yesterday she denies dysuria frequency or hesitancy.  She denies chest pain or shortness of breath.  Her symptoms came on insidiously.  Past Medical History:  Diagnosis Date  . Substance abuse [facility](HCC)     Patient Active Problem List   Diagnosis Date Noted  . Cellulitis of hand 11/30/2016  . Cellulitis and abscess of hand 11/30/2016  . Heroin abuse (HCC) 11/30/2016  . Hypokalemia 11/30/2016  . Hyponatremia 11/30/2016  . Smoker 11/30/2016  . Sepsis affecting skin (HCC) 11/30/2016    Past Surgical History:  Procedure Laterality Date  . EXTERNAL FIXATION ARM    . I&D EXTREMITY Left 12/01/2016   Procedure: IRRIGATION AND DEBRIDEMENT EXTREMITY;  Surgeon: Knute NeuHarrill Coley, MD;  Location: [facility] OR;  Service: Plastics;  Laterality: Left;    Prior to Admission medications   Medication Sig Start Date End Date Taking? Authorizing Provider  AZO-CRANBERRY PO Take 2 tablets by mouth 3 (three) times daily as needed. For pain    [provider]  cyclobenzaprine (FLEXERIL) 5 MG tablet Take 1 tablet (5 mg total) by mouth 3 (three) times daily. 12/04/16   Rolly SalterPatel, Pranav M, MD  dicyclomine (BENTYL) 20 MG tablet Take 1 tablet (20 mg total) by mouth 3 (three) times daily as needed  for spasms. 08/12/17 08/12/18  Merrily Brittleifenbark, Tarika Mckethan, MD  docusate sodium (COLACE) 100 MG capsule Take 1 capsule (100 mg total) by mouth 2 (two) times daily. 12/04/16   Rolly SalterPatel, Pranav M, MD  HYDROcodone-acetaminophen (NORCO) 5-325 MG tablet Take 1 tablet by mouth every 6 (six) hours as needed for up to 7 doses for severe pain. 08/12/17   Merrily Brittleifenbark, Nirvan Laban, MD  ibuprofen (ADVIL,MOTRIN) 600 MG tablet Take 1 tablet (600 mg total) by mouth every 8 (eight) hours as needed. 08/12/17   Merrily Brittleifenbark, Haifa Hatton, MD  nicotine (NICODERM CQ - DOSED IN MG/24 HOURS) 21 mg/24hr patch Place 1 patch (21 mg total) onto the skin daily. 12/05/16   Rolly SalterPatel, Pranav M, MD  ondansetron (ZOFRAN ODT) 4 MG disintegrating tablet Take 1 tablet (4 mg total) by mouth every 8 (eight) hours as needed for nausea or vomiting. 08/12/17   Merrily Brittleifenbark, Kamil Hanigan, MD  traMADol-acetaminophen (ULTRACET) 37.5-325 MG tablet Take 1 tablet by mouth every 6 (six) hours as needed. 12/04/16   Rolly SalterPatel, Pranav M, MD    Allergies Codeine; Penicillins; and Sulfa antibiotics  Family History  Problem Relation Age of Onset  . Emphysema Other     Social History Social History   Tobacco Use  . Smoking status: Current Every Day Smoker    Packs/day: 1.25    Types: Cigarettes  . Smokeless tobacco: Never Used  Substance Use Topics  . Alcohol use: Yes    Comment: occ  . Drug use: Yes    Types: IV  Comment: heroin, "second time I used" 11/28/16    Review of Systems Constitutional: No fever/chills Eyes: No visual changes. ENT: No sore throat. Cardiovascular: Denies chest pain. Respiratory: Denies shortness of breath. Gastrointestinal: Positive for abdominal pain.  Positive for nausea, positive for vomiting.  No diarrhea.  No constipation. Genitourinary: Negative for dysuria. Musculoskeletal: Negative for back pain. Skin: Negative for rash. Neurological: Negative for headaches, focal weakness or  numbness.   ____________________________________________   PHYSICAL EXAM:  VITAL SIGNS: ED Triage Vitals  Enc Vitals Group     BP 08/12/17 1609 (!) 105/56     Pulse Rate 08/12/17 1609 (!) 106     Resp 08/12/17 1609 18     Temp 08/12/17 1609 98.9 F (37.2 C)     Temp Source 08/12/17 1609 Oral     SpO2 08/12/17 1609 99 %     Weight 08/12/17 1609 130 lb (59 kg)     Height 08/12/17 1609 5\' 3"  (1.6 m)     Head Circumference --      Peak Flow --      Pain Score 08/12/17 1608 8     Pain Loc --      Pain Edu? --      Excl. in GC? --     Constitutional: Alert and oriented x4 appears uncomfortable turned on her side crying Eyes: PERRL EOMI. Head: Atraumatic. Nose: No congestion/rhinnorhea. Mouth/Throat: No trismus Neck: No stridor.   Cardiovascular: Tachycardic rate, regular rhythm. Grossly normal heart sounds.  Good peripheral circulation. Respiratory: Normal respiratory effort.  No retractions. Lungs CTAB and moving good air Gastrointestinal: Soft nondistended she is somewhat tender in her right upper quadrant with negative Murphy's no McBurney's tenderness a Rovsing's no costovertebral tenderness no frank peritonitis Musculoskeletal: No lower extremity edema   Neurologic:  Normal speech and language. No gross focal neurologic deficits are appreciated. Skin:  Skin is warm, dry and intact. No rash noted. Psychiatric: Mood and affect are normal. Speech and behavior are normal.    ____________________________________________   DIFFERENTIAL includes but not limited to  Biliary colic, cholecystitis, cholangitis, appendicitis, ovarian torsion, pyelonephritis, renal colic ____________________________________________   LABS (all labs ordered are listed, but only abnormal results are displayed)  Labs Reviewed  COMPREHENSIVE METABOLIC PANEL - Abnormal; Notable for the following components:      Result Value   Sodium 132 (*)    Chloride 96 (*)    BUN <5 (*)    Calcium 8.6 (*)     Total Protein 8.2 (*)    Albumin 2.8 (*)    All other components within normal limits  CBC - Abnormal; Notable for the following components:   Hemoglobin 10.5 (*)    HCT 30.4 (*)    MCV 76.5 (*)    Platelets 595 (*)    All other components within normal limits  URINALYSIS, COMPLETE (UACMP) WITH MICROSCOPIC - Abnormal; Notable for the following components:   Color, Urine STRAW (*)    APPearance CLEAR (*)    Specific Gravity, Urine 1.001 (*)    Hgb urine dipstick MODERATE (*)    Squamous Epithelial / LPF 0-5 (*)    All other components within normal limits  LIPASE, BLOOD  POC URINE PREG, ED    Blood work reviewed by me with no acute disease.  Her albumin of 2.8 is actually improved from previous __________________________________________  EKG   ____________________________________________  RADIOLOGY  Right upper quadrant ultrasound reviewed by me shows stones with no evidence of  cholecystitis ____________________________________________   PROCEDURES  Procedure(s) performed: no  Procedures  Critical Care performed: no  Observation: no ____________________________________________   INITIAL IMPRESSION / ASSESSMENT AND PLAN / ED COURSE  Pertinent labs & imaging results that were available during my care of the patient were reviewed by me and considered in my medical decision making (see chart for details).  The patient arrives with significant right-sided abdominal pain nausea and vomiting.  She is tender in her right upper quadrant and ultrasound confirms stones with no evidence of cholecystitis.  After Toradol and morphine the patient's pain is nearly completely resolved.  The patient has several issues going on right now.  She is an active intravenous heroin user and would like help stopping.  I will provide her with intranasal Narcan for home as well as resources for residential treatment services.  She declines TTS consult at this time.  She also does require  outpatient surgery referral.  Strict return precautions have been given and the patient verbalized understanding and agreement the plan.      ____________________________________________   FINAL CLINICAL IMPRESSION(S) / ED DIAGNOSES  Final diagnoses:  Pain  Biliary colic  Heroin addiction (HCC)      NEW MEDICATIONS STARTED DURING THIS VISIT:  This SmartLink is deprecated. Use AVSMEDLIST instead to display the medication list for a patient.   Note:  This document was prepared using Dragon voice recognition software and may include unintentional dictation errors.     Merrily Brittleifenbark, Keigan Girten, MD 08/14/17 [phone]

## 2017-08-12 NOTE — ED Notes (Addendum)
Patient transported to Ultrasound 

## 2017-08-12 NOTE — ED Triage Notes (Signed)
Right sided abdominal pain. Burning with urination X1 week. EMS vitals 116Pulse, 112/73 b/p, 100RA

## 2017-08-20 ENCOUNTER — Emergency Department (HOSPITAL_COMMUNITY): Payer: Medicaid Other

## 2017-08-20 ENCOUNTER — Encounter (HOSPITAL_COMMUNITY): Payer: Self-pay | Admitting: Emergency Medicine

## 2017-08-20 ENCOUNTER — Emergency Department (HOSPITAL_COMMUNITY)
Admission: EM | Admit: 2017-08-20 | Discharge: 2017-08-20 | Disposition: A | Discharge status: Home / Self Care | Payer: Medicaid Other | Attending: Emergency Medicine | Admitting: Emergency Medicine

## 2017-08-20 DIAGNOSIS — Y929 Unspecified place or not applicable: Secondary | ICD-10-CM | POA: Diagnosis not present

## 2017-08-20 DIAGNOSIS — Y998 Other external cause status: Secondary | ICD-10-CM | POA: Insufficient documentation

## 2017-08-20 DIAGNOSIS — Z880 Allergy status to penicillin: Secondary | ICD-10-CM | POA: Insufficient documentation

## 2017-08-20 DIAGNOSIS — F17210 Nicotine dependence, cigarettes, uncomplicated: Secondary | ICD-10-CM | POA: Diagnosis not present

## 2017-08-20 DIAGNOSIS — Y93K9 Activity, other involving animal care: Secondary | ICD-10-CM | POA: Diagnosis not present

## 2017-08-20 DIAGNOSIS — S20211A Contusion of right front wall of thorax, initial encounter: Secondary | ICD-10-CM | POA: Diagnosis not present

## 2017-08-20 DIAGNOSIS — Z885 Allergy status to narcotic agent status: Secondary | ICD-10-CM | POA: Diagnosis not present

## 2017-08-20 DIAGNOSIS — Z79899 Other long term (current) drug therapy: Secondary | ICD-10-CM | POA: Diagnosis not present

## 2017-08-20 DIAGNOSIS — S299XXA Unspecified injury of thorax, initial encounter: Secondary | ICD-10-CM | POA: Diagnosis present

## 2017-08-20 MED ORDER — NAPROXEN 375 MG PO TABS: 375.0000 mg | ORAL_TABLET | Freq: Two times a day (BID) | ORAL | 0 refills | Status: AC

## 2017-08-20 NOTE — ED Notes (Signed)
Pt verbalized understanding of dc and f/u instructions.  NAD noted.  Malfunction of e-sign at this time.  There are no further needs

## 2017-08-20 NOTE — Discharge Instructions (Signed)
Apply ice to affected area. Deep breathing exercises as discussed. Do not forget to follow-up with general surgery regarding your gallstones.

## 2017-08-20 NOTE — ED Triage Notes (Signed)
Pt presents with R rib pain after riding horses; no bruising noted; pt also reports needing her gallbladder removed per prior ER visit- has not scheduled with surgeon yet

## 2017-08-20 NOTE — ED Notes (Signed)
Pt returned from XR at this time. 

## 2017-08-20 NOTE — ED Provider Notes (Signed)
[facility] EMERGENCY DEPARTMENT Provider Note   CSN: [id number] Arrival date & time: 08/20/17  1612     History   Chief Complaint Chief Complaint  Patient presents with  . Rib pain    HPI Margot ChimesMelissa Verhoeven is a 31 y.o. female.  Patient with right sided chest wall pain in the lower lateral aspect, onset after riding/working with her horses. Per history and prior records, patient has cholelithiasis and has been referred for surgical consult. Patient states she has not had time to follow-up.    Chest Pain   This is a new problem. The current episode started 2 days ago. The problem has not changed since onset.The pain is associated with exertion, movement and breathing. The pain is present in the lateral region. The pain is mild. The quality of the pain is described as sharp and stabbing. The pain does not radiate. The symptoms are aggravated by certain positions and deep breathing. Pertinent negatives include no abdominal pain, no cough, no exertional chest pressure, no irregular heartbeat, no near-syncope and no palpitations. Risk factors include substance abuse.    Past Medical History:  Diagnosis Date  . Substance abuse [facility](HCC)     Patient Active Problem List   Diagnosis Date Noted  . Cellulitis of hand 11/30/2016  . Cellulitis and abscess of hand 11/30/2016  . Heroin abuse (HCC) 11/30/2016  . Hypokalemia 11/30/2016  . Hyponatremia 11/30/2016  . Smoker 11/30/2016  . Sepsis affecting skin (HCC) 11/30/2016    Past Surgical History:  Procedure Laterality Date  . EXTERNAL FIXATION ARM    . I&D EXTREMITY Left 12/01/2016   Procedure: IRRIGATION AND DEBRIDEMENT EXTREMITY;  Surgeon: Knute NeuHarrill Coley, MD;  Location: [facility] OR;  Service: Plastics;  Laterality: Left;    OB History    Gravida Para Term Preterm AB Living   2 2           SAB TAB Ectopic Multiple Live Births                  Obstetric Comments   IUD removed summer of 2016       Home  Medications    Prior to Admission medications   Medication Sig Start Date End Date Taking? Authorizing Provider  AZO-CRANBERRY PO Take 2 tablets by mouth 3 (three) times daily as needed. For pain    [provider]  cyclobenzaprine (FLEXERIL) 5 MG tablet Take 1 tablet (5 mg total) by mouth 3 (three) times daily. 12/04/16   Rolly SalterPatel, Pranav M, MD  dicyclomine (BENTYL) 20 MG tablet Take 1 tablet (20 mg total) by mouth 3 (three) times daily as needed for spasms. 08/12/17 08/12/18  Merrily Brittleifenbark, Neil, MD  docusate sodium (COLACE) 100 MG capsule Take 1 capsule (100 mg total) by mouth 2 (two) times daily. 12/04/16   Rolly SalterPatel, Pranav M, MD  HYDROcodone-acetaminophen (NORCO) 5-325 MG tablet Take 1 tablet by mouth every 6 (six) hours as needed for up to 7 doses for severe pain. 08/12/17   Merrily Brittleifenbark, Neil, MD  ibuprofen (ADVIL,MOTRIN) 600 MG tablet Take 1 tablet (600 mg total) by mouth every 8 (eight) hours as needed. 08/12/17   Merrily Brittleifenbark, Neil, MD  nicotine (NICODERM CQ - DOSED IN MG/24 HOURS) 21 mg/24hr patch Place 1 patch (21 mg total) onto the skin daily. 12/05/16   Rolly SalterPatel, Pranav M, MD  ondansetron (ZOFRAN ODT) 4 MG disintegrating tablet Take 1 tablet (4 mg total) by mouth every 8 (eight) hours as needed for nausea or vomiting.  08/12/17   Merrily Brittle, MD  traMADol-acetaminophen (ULTRACET) 37.5-325 MG tablet Take 1 tablet by mouth every 6 (six) hours as needed. 12/04/16   Rolly Salter, MD    Family History Family History  Problem Relation Age of Onset  . Emphysema Other     Social History Social History   Tobacco Use  . Smoking status: Current Every Day Smoker    Packs/day: 1.25    Types: Cigarettes  . Smokeless tobacco: Never Used  Substance Use Topics  . Alcohol use: No    Frequency: Never    Comment: occ  . Drug use: Yes    Types: IV    Comment: heroin, "second time I used" 11/28/16     Allergies   Codeine; Penicillins; and Sulfa antibiotics   Review of Systems Review of  Systems  Constitutional: Negative for fatigue.  Respiratory: Negative for cough.   Cardiovascular: Positive for chest pain. Negative for palpitations and near-syncope.  Gastrointestinal: Negative for abdominal pain.  All other systems reviewed and are negative.    Physical Exam Updated Vital Signs BP 128/78 (BP Location: Right Arm)   Pulse 99   Temp 98.7 F (37.1 C) (Oral)   Ht 5\' 3"  (1.6 m)   Wt 59 kg (130 lb)   LMP 08/10/2017   SpO2 98%   BMI 23.03 kg/m   Physical Exam  Constitutional: She is oriented to person, place, and time. She appears well-developed and well-nourished.  HENT:  Head: Atraumatic.  Eyes: Conjunctivae are normal.  Neck: Neck supple.  Cardiovascular: Normal rate and regular rhythm.  Pulmonary/Chest: Effort normal and breath sounds normal.  Abdominal: Soft. She exhibits no distension. There is no tenderness.  Musculoskeletal: Normal range of motion. She exhibits tenderness.  No tenderness along thoracic spine. Mild tenderness to right lateral ribs. No deformity or crepitus.  Neurological: She is alert and oriented to person, place, and time.  Skin: Skin is warm and dry.  Psychiatric: She has a normal mood and affect.  Nursing note and vitals reviewed.    ED Treatments / Results  Labs (all labs ordered are listed, but only abnormal results are displayed) Labs Reviewed - No data to display  EKG  EKG Interpretation None       Radiology Dg Chest 2 View  Result Date: 08/20/2017 CLINICAL DATA:  Right rib pain after falling off a horse. EXAM: CHEST  2 VIEW COMPARISON:  None. FINDINGS: The cardiomediastinal silhouette is normal in size. Normal pulmonary vascularity. No focal consolidation, pleural effusion, or pneumothorax. Minimal wedging of a midthoracic vertebral body. No definite rib fracture. IMPRESSION: 1.  No active cardiopulmonary disease. 2. No definite rib fracture. 3. Minimal wedging of a midthoracic vertebral body. Correlate with point  tenderness to exclude acute fracture. Electronically Signed   By: Obie Dredge M.D.   On: 08/20/2017 17:38    Procedures Procedures (including critical care time)  Medications Ordered in ED Medications - No data to display   Initial Impression / Assessment and Plan / ED Course  I have reviewed the triage vital signs and the nursing notes.  Pertinent labs & imaging results that were available during my care of the patient were reviewed by me and considered in my medical decision making (see chart for details).    Patient with right lateral chest wall pain after fall. Xray negative for rib fracture. No associated thoracic spine pain or tenderness. Patient diagnosed with rib contusion. Care instructions and return precautions discussed, questions answered. Patient  voices understanding.   Final Clinical Impressions(s) / ED Diagnoses   Final diagnoses:  Contusion of rib on right side, initial encounter    ED Discharge Orders        Ordered    naproxen (NAPROSYN) 375 MG tablet  2 times daily     08/20/17 1754       Felicie MornSmith, Emmitte Surgeon, NP 08/20/17 Orpah [id number]    Goldston, Scott, MD 08/21/17 [phone]

## 2017-08-24 IMAGING — DX DG FOREARM 2V*L*
2 series · 2 of 2 positions shown · non-contrast
Comparison: None.

CLINICAL DATA: Cellulitis of the left hand and wrist

EXAM:
LEFT FOREARM - 2 VIEW

[forearm ap]
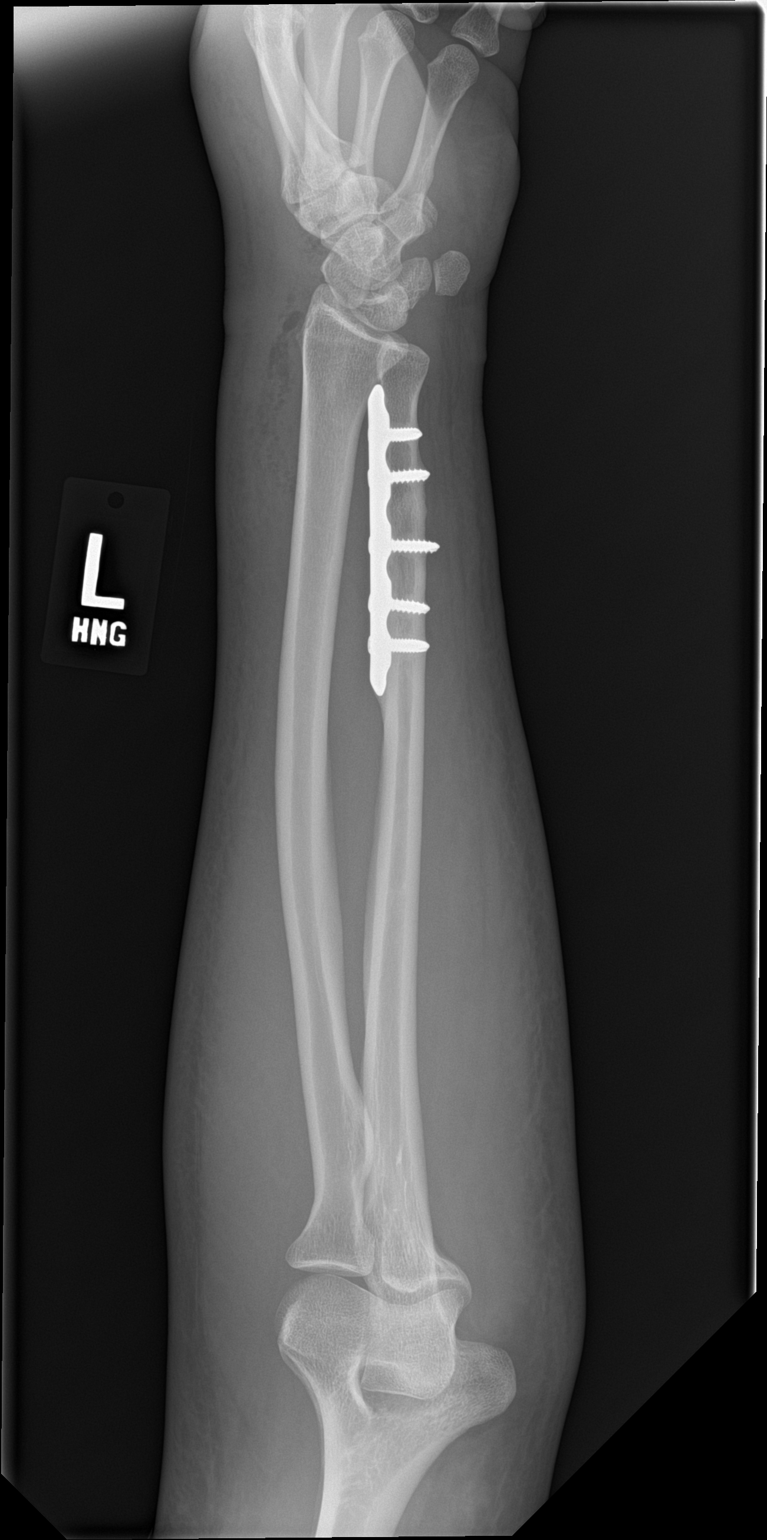

[forearm lat]
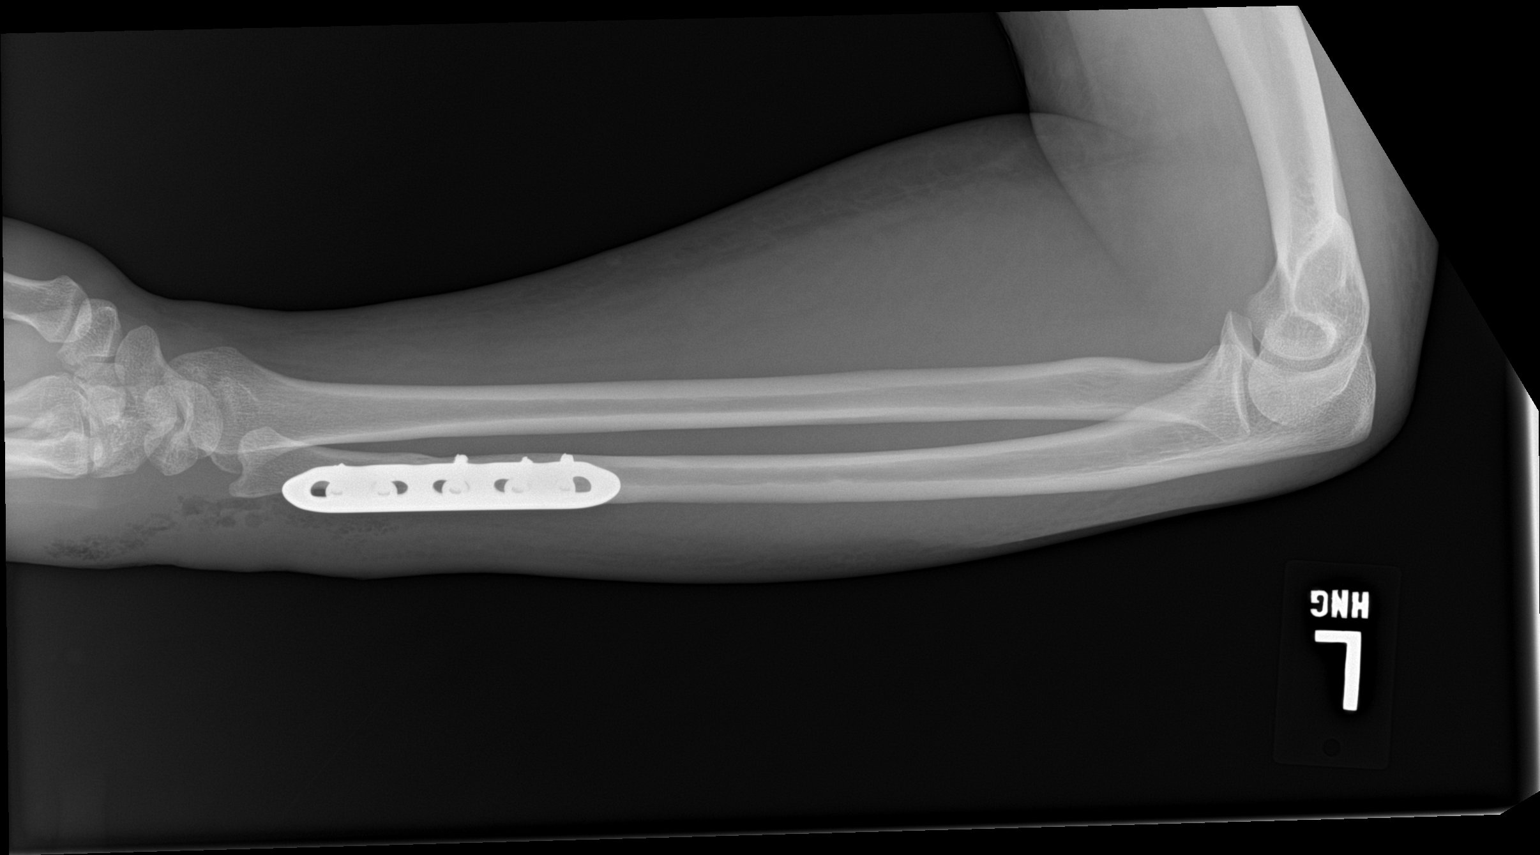

[2 of 2 positions shown; findings below may reference images not displayed]

FINDINGS: Surgical plate and screw fixation of the distal ulna with grossly
intact hardware. Diffuse subcutaneous edema with multiple pockets of
soft tissue gas along the distal dorsal forearm and wrist. No
fracture or periostitis. No radiopaque foreign body.
IMPRESSION: 1. Surgical plate and screw fixation of the distal ulna. No acute
osseous abnormality
2. Diffuse soft tissue swelling with soft tissue gas present along
the dorsal aspect of the distal forearm and wrist.

## 2017-08-24 IMAGING — DX DG HAND COMPLETE 3+V*L*
3 series · 3 of 3 positions shown · non-contrast
Comparison: None.

CLINICAL DATA: Pt has cellulitis of the left hand, wrist, and
forearm. Pt had open wound on the posterior aspect of the wrist,
that was oozing with strong odor. Previous sx x 8 yrs ago. Best
obtainable images due to limited range of motion because of
inflammation.

EXAM:
LEFT HAND - COMPLETE 3+ VIEW

[hand pa]
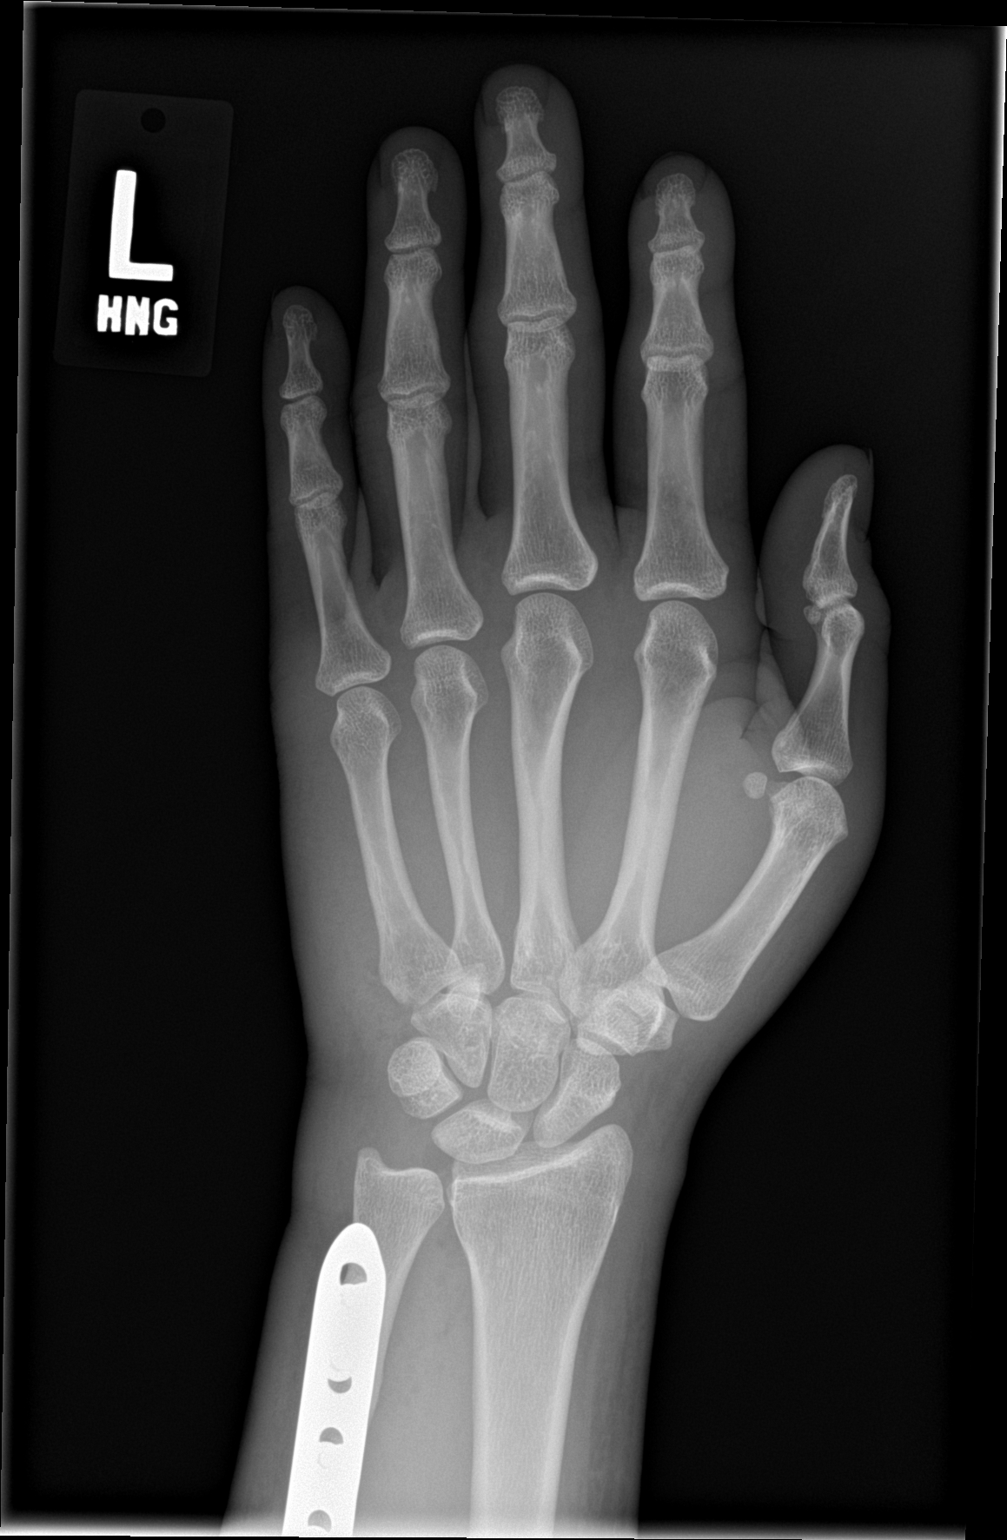

[hand obl]
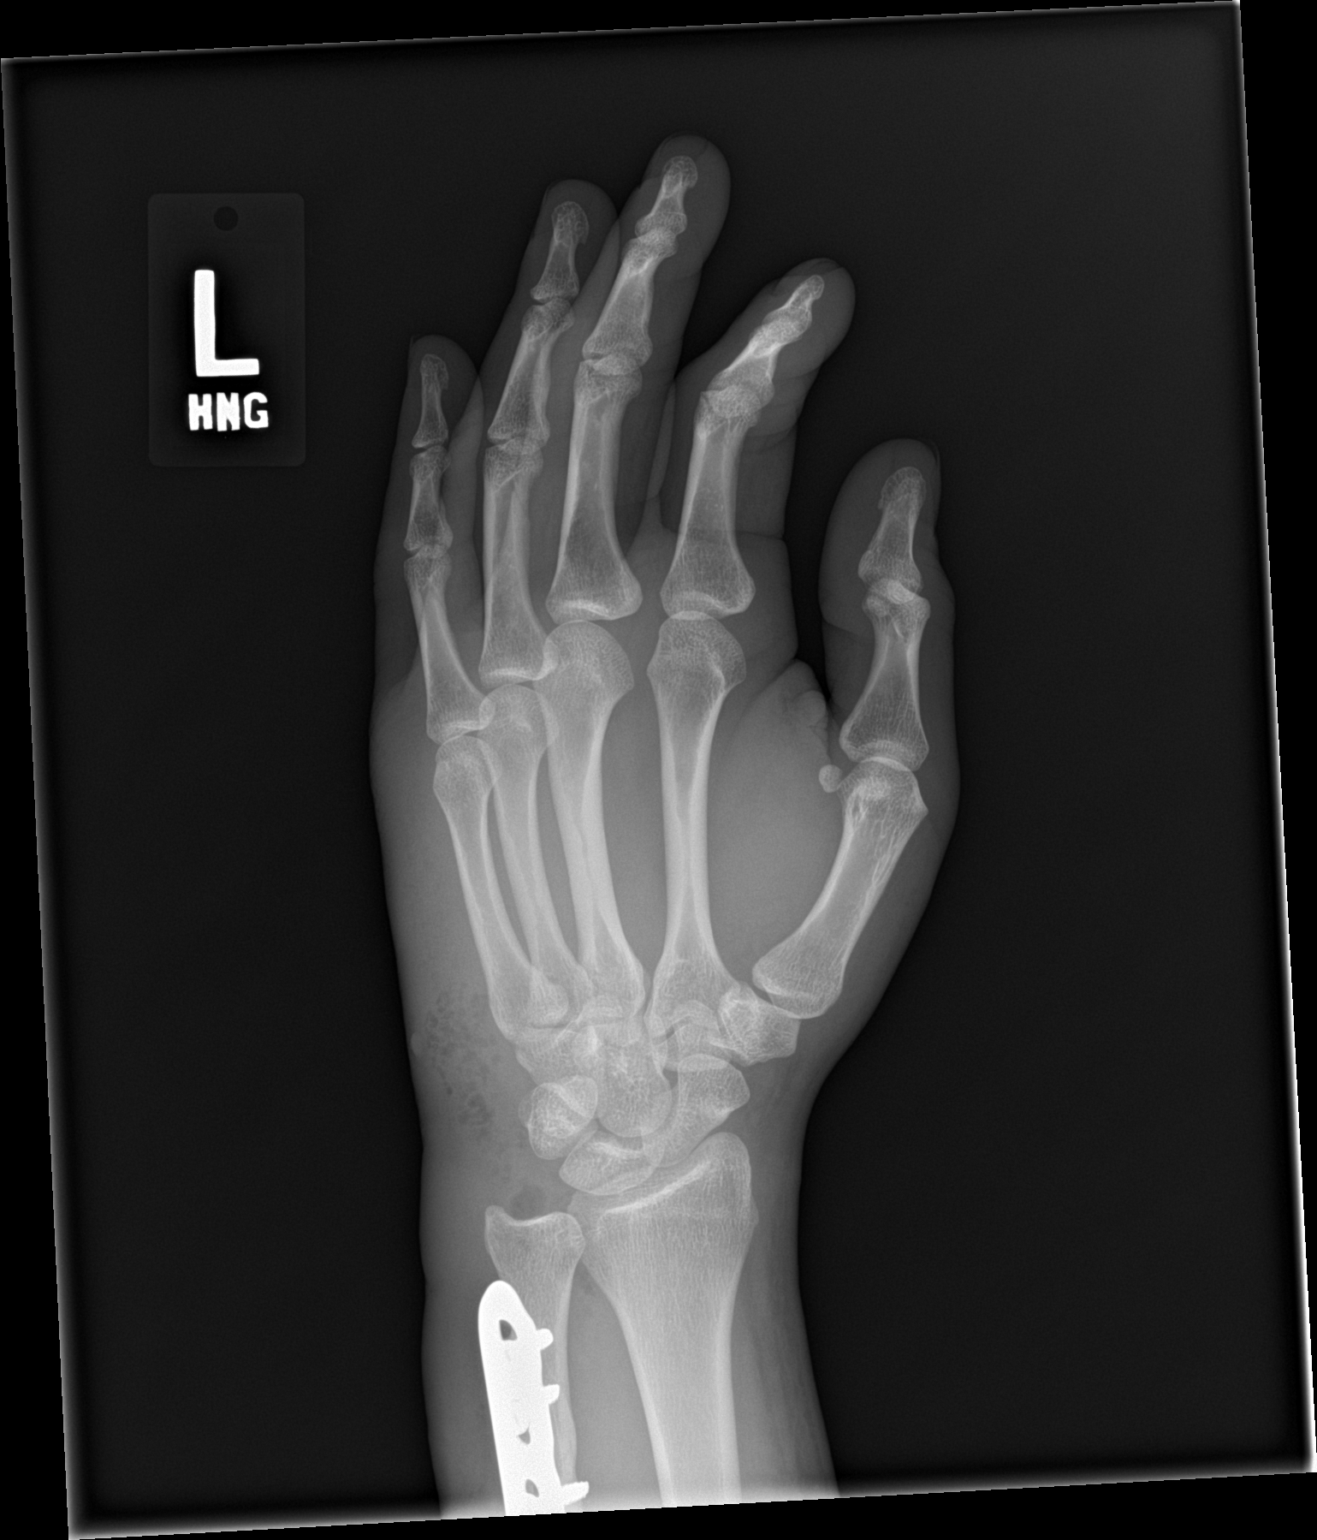

[hand lat]
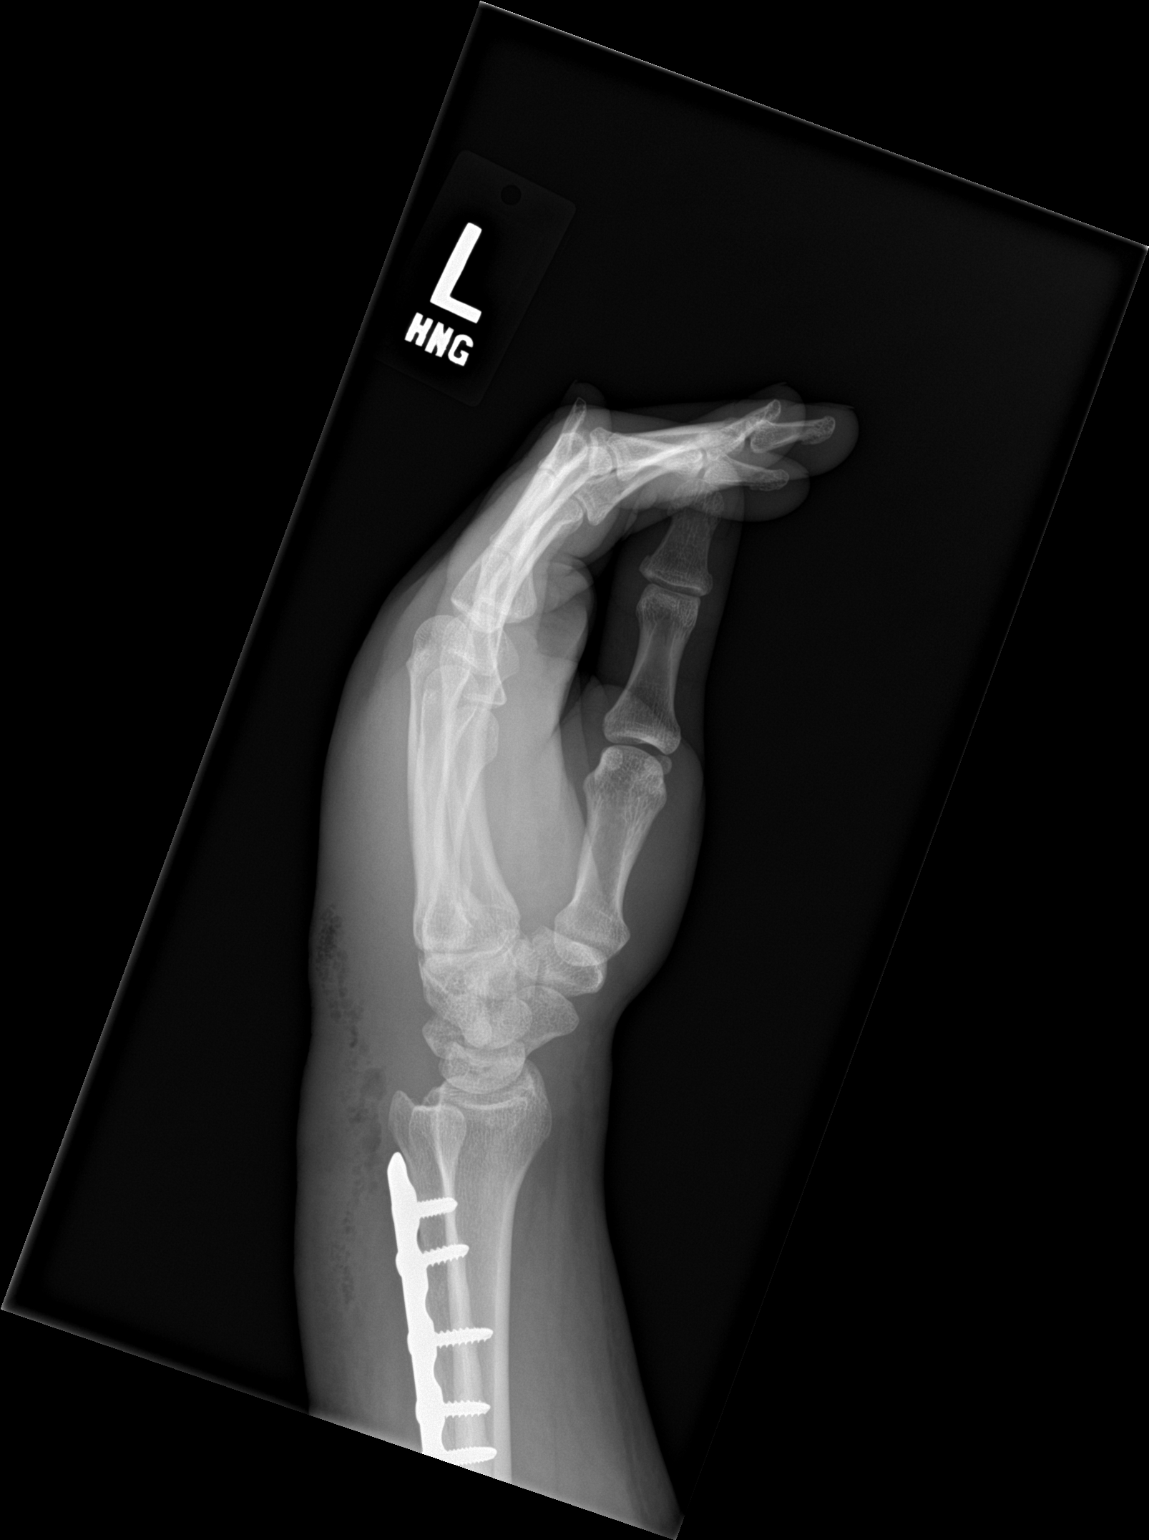

[3 of 3 positions shown; findings below may reference images not displayed]

FINDINGS: Subcutaneous gas in the lateral ulnar aspect of the rib extending
from the carpal metacarpal level to the distal ulnar diaphysis. Gas
extends over an 8 cm length in a linear distribution.

Dynamic fixation plate of the distal ulna.
IMPRESSION: Subcutaneous gas in the dorsum of the LEFT wrist along the ulnar
side spanning the radiocarpal joint. Findings consistent with gas
forming bacterial infection.

Dynamic fixation plate along the distal ulna.

These results will be called to the ordering clinician or
representative by the Radiologist Assistant, and communication
documented in the PACS or zVision Dashboard.

## 2018-06-07 ENCOUNTER — Emergency Department (HOSPITAL_COMMUNITY)
Admission: EM | Admit: 2018-06-07 | Discharge: 2018-06-07 | Disposition: A | Discharge status: Home / Self Care | Payer: Medicaid Other | Attending: Emergency Medicine | Admitting: Emergency Medicine

## 2018-06-07 ENCOUNTER — Encounter (HOSPITAL_COMMUNITY): Payer: Self-pay | Admitting: Emergency Medicine

## 2018-06-07 ENCOUNTER — Emergency Department (HOSPITAL_COMMUNITY): Payer: Medicaid Other

## 2018-06-07 DIAGNOSIS — F17210 Nicotine dependence, cigarettes, uncomplicated: Secondary | ICD-10-CM | POA: Insufficient documentation

## 2018-06-07 DIAGNOSIS — F1910 Other psychoactive substance abuse, uncomplicated: Secondary | ICD-10-CM | POA: Diagnosis not present

## 2018-06-07 DIAGNOSIS — Z79899 Other long term (current) drug therapy: Secondary | ICD-10-CM | POA: Insufficient documentation

## 2018-06-07 DIAGNOSIS — T401X1A Poisoning by heroin, accidental (unintentional), initial encounter: Secondary | ICD-10-CM | POA: Diagnosis not present

## 2018-06-07 LAB — CBC WITH DIFFERENTIAL/PLATELET
Basophils Absolute: 0 10*3/uL (ref 0.0–0.1)
Basophils Relative: 0 %
Eosinophils Absolute: 0.1 10*3/uL (ref 0.0–0.7)
Eosinophils Relative: 1 %
HEMATOCRIT: 34.2 % — AB (ref 36.0–46.0)
HEMOGLOBIN: 11.7 g/dL — AB (ref 12.0–15.0)
LYMPHS ABS: 0.9 10*3/uL (ref 0.7–4.0)
LYMPHS PCT: 12 %
MCH: 26.6 pg (ref 26.0–34.0)
MCHC: 34.2 g/dL (ref 30.0–36.0)
MCV: 77.7 fL — AB (ref 78.0–100.0)
MONO ABS: 0.4 10*3/uL (ref 0.1–1.0)
MONOS PCT: 5 %
NEUTROS ABS: 6.2 10*3/uL (ref 1.7–7.7)
NEUTROS PCT: 82 %
Platelets: 243 10*3/uL (ref 150–400)
RBC: 4.4 MIL/uL (ref 3.87–5.11)
RDW: 14.5 % (ref 11.5–15.5)
WBC: 7.6 10*3/uL (ref 4.0–10.5)

## 2018-06-07 LAB — COMPREHENSIVE METABOLIC PANEL
ALBUMIN: 4 g/dL (ref 3.5–5.0)
ALK PHOS: 68 U/L (ref 38–126)
ALT: 49 U/L — ABNORMAL HIGH (ref 0–44)
ANION GAP: 10 (ref 5–15)
AST: 63 U/L — ABNORMAL HIGH (ref 15–41)
BILIRUBIN TOTAL: 0.8 mg/dL (ref 0.3–1.2)
BUN: 10 mg/dL (ref 6–20)
CALCIUM: 9 mg/dL (ref 8.9–10.3)
CO2: 27 mmol/L (ref 22–32)
Chloride: 102 mmol/L (ref 98–111)
Creatinine, Ser: 0.78 mg/dL (ref 0.44–1.00)
GFR calc Af Amer: >60 mL/min (ref >60)
GLUCOSE: 111 mg/dL — AB (ref 70–99)
Potassium: 3.5 mmol/L (ref 3.5–5.1)
Sodium: 139 mmol/L (ref 135–145)
TOTAL PROTEIN: 8.2 g/dL — AB (ref 6.5–8.1)

## 2018-06-07 LAB — RAPID URINE DRUG SCREEN, HOSP PERFORMED
AMPHETAMINES: POSITIVE — AB
Barbiturates: NOT DETECTED
Benzodiazepines: POSITIVE — AB
Cocaine: NOT DETECTED
Opiates: POSITIVE — AB
TETRAHYDROCANNABINOL: POSITIVE — AB

## 2018-06-07 LAB — I-STAT BETA HCG BLOOD, ED (MC, WL, AP ONLY): HCG, QUANTITATIVE: 14.2 m[IU]/mL — AB (ref <5)

## 2018-06-07 LAB — SALICYLATE LEVEL: Salicylate Lvl: <7 mg/dL (ref 2.8–30.0)

## 2018-06-07 LAB — ACETAMINOPHEN LEVEL

## 2018-06-07 LAB — I-STAT TROPONIN, ED: TROPONIN I, POC: 0 ng/mL (ref 0.00–0.08)

## 2018-06-07 LAB — ETHANOL

## 2018-06-07 LAB — PREGNANCY, URINE: Preg Test, Ur: NEGATIVE

## 2018-06-07 MED ORDER — IBUPROFEN 800 MG PO TABS
800.0000 mg | ORAL_TABLET | Freq: Once | ORAL | Status: AC
  Administered 2018-06-07: 800 mg via ORAL
  Filled 2018-06-07: qty 1

## 2018-06-07 MED ORDER — SODIUM CHLORIDE 0.9 % IV BOLUS
1000.0000 mL | Freq: Once | INTRAVENOUS | Status: AC
  Administered 2018-06-07: 1000 mL via INTRAVENOUS

## 2018-06-07 NOTE — ED Provider Notes (Signed)
[facility]-EMERGENCY DEPT Provider Note   CSN: [id number] Arrival date & time: 06/07/18  1553     History   Chief Complaint Chief Complaint  Patient presents with  . Drug Overdose    HPI Margot ChimesMelissa Deetz is a 32 y.o. female history of known heroin abuse here presenting with possible heroin overdose.  Patient states that she occasionally abuses heroin but was clean for several years.  She states that she just relapsed today.  She shot some heroin into the right antecubital about 3 PM today.  Her boyfriend found her altered and lethargic.  He started chest compression on her and called EMS.  EMS noticed that she had a pulse and started bagging her and gave her Narcan.  She states that her chest hurts currently from the chest compressions. She denies any alcohol use. Denies any thoughts of harming herself or others   The history is provided by the patient.    Past Medical History:  Diagnosis Date  . Substance abuse [facility](HCC)     Patient Active Problem List   Diagnosis Date Noted  . Cellulitis of hand 11/30/2016  . Cellulitis and abscess of hand 11/30/2016  . Heroin abuse (HCC) 11/30/2016  . Hypokalemia 11/30/2016  . Hyponatremia 11/30/2016  . Smoker 11/30/2016  . Sepsis affecting skin 11/30/2016    Past Surgical History:  Procedure Laterality Date  . EXTERNAL FIXATION ARM    . I&D EXTREMITY Left 12/01/2016   Procedure: IRRIGATION AND DEBRIDEMENT EXTREMITY;  Surgeon: Knute NeuHarrill Coley, MD;  Location: [facility] OR;  Service: Plastics;  Laterality: Left;     OB History    Gravida  2   Para  2   Term      Preterm      AB      Living        SAB      TAB      Ectopic      Multiple      Live Births           Obstetric Comments  IUD removed summer of 2016         Home Medications    Prior to Admission medications   Medication Sig Start Date End Date Taking? Authorizing Provider  AZO-CRANBERRY PO Take 2 tablets by mouth 3 (three) times daily  as needed. For pain    [provider]  cyclobenzaprine (FLEXERIL) 5 MG tablet Take 1 tablet (5 mg total) by mouth 3 (three) times daily. 12/04/16   Rolly SalterPatel, Pranav M, MD  dicyclomine (BENTYL) 20 MG tablet Take 1 tablet (20 mg total) by mouth 3 (three) times daily as needed for spasms. 08/12/17 08/12/18  Merrily Brittleifenbark, Neil, MD  docusate sodium (COLACE) 100 MG capsule Take 1 capsule (100 mg total) by mouth 2 (two) times daily. 12/04/16   Rolly SalterPatel, Pranav M, MD  HYDROcodone-acetaminophen (NORCO) 5-325 MG tablet Take 1 tablet by mouth every 6 (six) hours as needed for up to 7 doses for severe pain. 08/12/17   Merrily Brittleifenbark, Neil, MD  ibuprofen (ADVIL,MOTRIN) 600 MG tablet Take 1 tablet (600 mg total) by mouth every 8 (eight) hours as needed. 08/12/17   Merrily Brittleifenbark, Neil, MD  naproxen (NAPROSYN) 375 MG tablet Take 1 tablet (375 mg total) by mouth 2 (two) times daily. 08/20/17   Felicie MornSmith, Zeriah Baysinger, NP  nicotine (NICODERM CQ - DOSED IN MG/24 HOURS) 21 mg/24hr patch Place 1 patch (21 mg total) onto the skin daily. 12/05/16   Rolly SalterPatel, Pranav M,  MD  ondansetron (ZOFRAN ODT) 4 MG disintegrating tablet Take 1 tablet (4 mg total) by mouth every 8 (eight) hours as needed for nausea or vomiting. 08/12/17   Merrily Brittle, MD  traMADol-acetaminophen (ULTRACET) 37.5-325 MG tablet Take 1 tablet by mouth every 6 (six) hours as needed. 12/04/16   Rolly Salter, MD    Family History Family History  Problem Relation Age of Onset  . Emphysema Other     Social History Social History   Tobacco Use  . Smoking status: Current Every Day Smoker    Packs/day: 1.25    Types: Cigarettes  . Smokeless tobacco: Never Used  Substance Use Topics  . Alcohol use: No    Frequency: Never    Comment: occ  . Drug use: Yes    Types: IV    Comment: heroin, "second time I used" 11/28/16     Allergies   Codeine; Penicillins; and Sulfa antibiotics   Review of Systems Review of Systems  Cardiovascular: Positive for chest pain.  All  other systems reviewed and are negative.    Physical Exam Updated Vital Signs BP 120/89   Pulse (!) 102   Resp 18   Ht 5\' 3"  (1.6 m)   Wt 61.2 kg   LMP 05/12/2018 (Approximate)   SpO2 100%   BMI 23.91 kg/m   Physical Exam  Constitutional: She is oriented to person, place, and time.  Awake, alert, slightly uncomfortable   HENT:  Head: Normocephalic.  Eyes: Pupils are equal, round, and reactive to light. Conjunctivae and EOM are normal.  Neck: Normal range of motion. Neck supple.  Cardiovascular: Normal rate, regular rhythm and normal heart sounds.  Pulmonary/Chest: Effort normal and breath sounds normal.  + sternal tenderness, no obvious bruising or deformity   Abdominal: Soft. Bowel sounds are normal. She exhibits no distension. There is no tenderness.  Musculoskeletal: Normal range of motion.  Track marks bilateral arms, no obvious abscess   Neurological: She is alert and oriented to person, place, and time.  Skin: Skin is warm.  Psychiatric: She has a normal mood and affect.  Nursing note and vitals reviewed.    ED Treatments / Results  Labs (all labs ordered are listed, but only abnormal results are displayed) Labs Reviewed  CBC WITH DIFFERENTIAL/PLATELET - Abnormal; Notable for the following components:      Result Value   Hemoglobin 11.7 (*)    HCT 34.2 (*)    MCV 77.7 (*)    All other components within normal limits  COMPREHENSIVE METABOLIC PANEL - Abnormal; Notable for the following components:   Glucose, Bld 111 (*)    Total Protein 8.2 (*)    AST 63 (*)    ALT 49 (*)    All other components within normal limits  ACETAMINOPHEN LEVEL - Abnormal; Notable for the following components:   Acetaminophen (Tylenol), Serum <10 (*)    All other components within normal limits  RAPID URINE DRUG SCREEN, HOSP PERFORMED - Abnormal; Notable for the following components:   Opiates POSITIVE (*)    Benzodiazepines POSITIVE (*)    Amphetamines POSITIVE (*)     Tetrahydrocannabinol POSITIVE (*)    All other components within normal limits  I-STAT BETA HCG BLOOD, ED ([facility], [facility], [facility] ONLY) - Abnormal; Notable for the following components:   I-stat hCG, quantitative 14.2 (*)    All other components within normal limits  ETHANOL  SALICYLATE LEVEL  PREGNANCY, URINE  I-STAT TROPONIN, ED    EKG  EKG Interpretation  Date/Time:  Saturday June 07 2018 16:26:12 EDT Ventricular Rate:  105 PR Interval:    QRS Duration: 95 QT Interval:  345 QTC Calculation: 456 R Axis:   85 Text Interpretation:  Sinus tachycardia Probable left atrial enlargement No previous ECGs available Confirmed by Richardean Canal ([id number]) on 06/07/2018 6:27:54 PM   Radiology Dg Chest 2 View  Result Date: 06/07/2018 CLINICAL DATA:  Heroin overdose. EXAM: CHEST - 2 VIEW COMPARISON:  08/20/2017. FINDINGS: Normal sized heart. Clear lungs. Minimal thoracic spine degenerative changes with stable minimal wedge deformity of a midthoracic vertebral body. IMPRESSION: No acute abnormality. Electronically Signed   By: Beckie Salts M.D.   On: 06/07/2018 17:38    Procedures Procedures (including critical care time)  Medications Ordered in ED Medications  sodium chloride 0.9 % bolus 1,000 mL (0 mLs Intravenous Stopped 06/07/18 1751)  ibuprofen (ADVIL,MOTRIN) tablet 800 mg (800 mg Oral Given 06/07/18 1750)     Initial Impression / Assessment and Plan / ED Course  I have reviewed the triage vital signs and the nursing notes.  Pertinent labs & imaging results that were available during my care of the patient were reviewed by me and considered in my medical decision making (see chart for details).     Larena Thorup is a 32 y.o. female here with heroin overdose. Given narcan by EMS and now awake and alert. Denies suicidal attempt states that she is addicted to it. Will get labs, tox. Will monitor for 3 hours.    6:59 PM  Labs unremarkable. UDS + multiple drugs. Observed for 3 hours. Awake  and alert now, ate food. Told her to avoid doing heroin. Will give list of resources and peer support.   Final Clinical Impressions(s) / ED Diagnoses   Final diagnoses:  None    ED Discharge Orders    None       Charlynne Pander, MD 06/07/18 1902

## 2018-06-07 NOTE — ED Notes (Signed)
Pt provided with sandwich per request. Dr Silverio LayYao stated it was ok for pt to eat

## 2018-06-07 NOTE — ED Triage Notes (Addendum)
Pt arrives via EMS post [facility] OD [facility]. Pt was found unresponsive. Fire arrived on scene and bagged pt after pt BF did chest compressions. Fire admin 1mg  IN Narcan and pt became responsive.Pt stated that she was supposed to have bought heroin, but told EMS that she thinks it was fentanyl. Pt received 4 Zofran en route for emesis. Pt has nasal trauma from NPA placed on scene. Pt is A&O and in NAD

## 2018-06-07 NOTE — Discharge Instructions (Signed)
Avoid using heroin and other drugs.   See your doctor and a counselor.   Return to ER if you have thoughts of harming yourself or others, overdose, hallucinations.

## 2018-06-07 NOTE — ED Notes (Signed)
Bed: WA17 Expected date:  Expected time:  Means of arrival:  Comments: OD, a&O now

## 2018-06-07 NOTE — ED Notes (Signed)
Patient given hot pack

## 2018-10-14 ENCOUNTER — Other Ambulatory Visit: Payer: Self-pay

## 2018-10-14 ENCOUNTER — Emergency Department (HOSPITAL_COMMUNITY)
Admission: EM | Admit: 2018-10-14 | Discharge: 2018-10-16 | Disposition: A | Discharge status: Other Acute Inpatient Hospital | Payer: Medicaid Other | Attending: Emergency Medicine | Admitting: Emergency Medicine

## 2018-10-14 ENCOUNTER — Encounter (HOSPITAL_COMMUNITY): Payer: Self-pay | Admitting: Obstetrics and Gynecology

## 2018-10-14 DIAGNOSIS — Z79899 Other long term (current) drug therapy: Secondary | ICD-10-CM | POA: Insufficient documentation

## 2018-10-14 DIAGNOSIS — F17210 Nicotine dependence, cigarettes, uncomplicated: Secondary | ICD-10-CM | POA: Insufficient documentation

## 2018-10-14 DIAGNOSIS — E876 Hypokalemia: Secondary | ICD-10-CM | POA: Insufficient documentation

## 2018-10-14 DIAGNOSIS — F1120 Opioid dependence, uncomplicated: Secondary | ICD-10-CM | POA: Insufficient documentation

## 2018-10-14 DIAGNOSIS — R45851 Suicidal ideations: Secondary | ICD-10-CM | POA: Insufficient documentation

## 2018-10-14 DIAGNOSIS — F1910 Other psychoactive substance abuse, uncomplicated: Secondary | ICD-10-CM

## 2018-10-14 DIAGNOSIS — F99 Mental disorder, not otherwise specified: Secondary | ICD-10-CM | POA: Diagnosis present

## 2018-10-14 HISTORY — DX: Unspecified viral hepatitis C without hepatic coma: B19.20

## 2018-10-14 HISTORY — DX: Endometriosis, unspecified: N80.9

## 2018-10-14 HISTORY — DX: Endocarditis, valve unspecified: I38

## 2018-10-14 LAB — RAPID URINE DRUG SCREEN, HOSP PERFORMED
Amphetamines: NOT DETECTED
BARBITURATES: NOT DETECTED
Benzodiazepines: NOT DETECTED
Cocaine: NOT DETECTED
Opiates: POSITIVE — AB
Tetrahydrocannabinol: NOT DETECTED

## 2018-10-14 LAB — COMPREHENSIVE METABOLIC PANEL
ALT: 59 U/L — ABNORMAL HIGH (ref 0–44)
AST: 59 U/L — AB (ref 15–41)
Albumin: 3.5 g/dL (ref 3.5–5.0)
Alkaline Phosphatase: 54 U/L (ref 38–126)
Anion gap: 6 (ref 5–15)
BUN: 6 mg/dL (ref 6–20)
CO2: 23 mmol/L (ref 22–32)
Calcium: 8.4 mg/dL — ABNORMAL LOW (ref 8.9–10.3)
Chloride: 108 mmol/L (ref 98–111)
Creatinine, Ser: 0.66 mg/dL (ref 0.44–1.00)
GFR calc Af Amer: >60 mL/min (ref >60)
GFR calc non Af Amer: >60 mL/min (ref >60)
Glucose, Bld: 106 mg/dL — ABNORMAL HIGH (ref 70–99)
Potassium: 3.3 mmol/L — ABNORMAL LOW (ref 3.5–5.1)
Sodium: 137 mmol/L (ref 135–145)
Total Bilirubin: 0.2 mg/dL — ABNORMAL LOW (ref 0.3–1.2)
Total Protein: 7.4 g/dL (ref 6.5–8.1)

## 2018-10-14 LAB — CBC
HCT: 35 % — ABNORMAL LOW (ref 36.0–46.0)
Hemoglobin: 11.1 g/dL — ABNORMAL LOW (ref 12.0–15.0)
MCH: 24.7 pg — ABNORMAL LOW (ref 26.0–34.0)
MCHC: 31.7 g/dL (ref 30.0–36.0)
MCV: 78 fL — ABNORMAL LOW (ref 80.0–100.0)
Platelets: 246 10*3/uL (ref 150–400)
RBC: 4.49 MIL/uL (ref 3.87–5.11)
RDW: 15.1 % (ref 11.5–15.5)
WBC: 5.6 10*3/uL (ref 4.0–10.5)
nRBC: 0 % (ref 0.0–0.2)

## 2018-10-14 LAB — ETHANOL: Alcohol, Ethyl (B): <10 mg/dL (ref <10)

## 2018-10-14 LAB — I-STAT BETA HCG BLOOD, ED (MC, WL, AP ONLY): I-stat hCG, quantitative: <5 m[IU]/mL (ref <5)

## 2018-10-14 LAB — SALICYLATE LEVEL: Salicylate Lvl: <7 mg/dL (ref 2.8–30.0)

## 2018-10-14 LAB — ACETAMINOPHEN LEVEL: Acetaminophen (Tylenol), Serum: <10 ug/mL — ABNORMAL LOW (ref 10–30)

## 2018-10-14 MED ORDER — POTASSIUM CHLORIDE CRYS ER 20 MEQ PO TBCR
40.0000 meq | EXTENDED_RELEASE_TABLET | Freq: Once | ORAL | Status: AC
  Administered 2018-10-14: 40 meq via ORAL
  Filled 2018-10-14: qty 2

## 2018-10-14 NOTE — ED Triage Notes (Signed)
Pt reports she has been battling heroin addiction for approximately 7 years. Pt reports she has been doing heroin with last use being yesterday. Pt reports she wants to get clean and feels like she is useless for not able to get clean or she feels like she will "give up on life"

## 2018-10-14 NOTE — ED Notes (Signed)
Pt arrived to room 36 alert and oriented .  Patient appears to know the patient in 69 as they arrived at the same time.  Pt denies knowing the patient.

## 2018-10-14 NOTE — ED Notes (Signed)
Bed: [facility]4 Expected date:  Expected time:  Means of arrival:  Comments: 

## 2018-10-14 NOTE — ED Notes (Signed)
Pt A&O x 3, no distress noted, calm & cooperative, sleeping at present, passive SI noted.  Monitoring for safety, Q 15 min checks in effect.

## 2018-10-14 NOTE — ED Provider Notes (Signed)
Medical screening examination/treatment/procedure(s) were performed by non-physician practitioner and as supervising physician I was immediately available for consultation/collaboration.  None ED ECG REPORT   Date: 10/14/2018  Rate: 67  Rhythm: normal sinus rhythm  QRS Axis: normal  Intervals: normal  ST/T Wave abnormalities: normal  Conduction Disutrbances:none  Narrative Interpretation:   Old EKG Reviewed: none available  I have personally reviewed the EKG tracing and agree with the computerized printout as noted.    Lorre Nick, MD 10/14/18 Margretta Ditty

## 2018-10-14 NOTE — ED Provider Notes (Signed)
[facility]-EMERGENCY DEPT Provider Note   CSN: [id number] Arrival date & time: 10/14/18  1518     History   Chief Complaint Chief Complaint  Patient presents with  . Suicidal  . Drug Problem    HPI Maria Abbott is a 33 y.o. female.  Maria Abbott is a 33 y.o. female with a history of endocarditis, endometriosis, hepatitis C and substance abuse, who presents to the emergency department for evaluation of suicidal ideations.  Patient reports that she has been an abuser of heroin over the past 7 years and has tried to stop multiple times but has been unsuccessful, she had a 29-month hospitalization last summer after endocarditis but when she was discharged return to using heroin, she reports that she feels as though she just wants to give up, she has 2 sons and feels that she cannot be a good mother to them because of her drug abuse and is having thoughts of wanting to harm herself.  She denies any specific plan as to how she would harm herself.  Denies any homicidal ideations, no auditory or visual hallucinations.  Reports she felt very anxious and scared when she came here today.  She reports she was previously on Paxil and alprazolam to help with her depressive symptoms but has been without these medications for several months.  Denies prior history of suicide attempts.  Denies any ingestions or attempts to harm herself prior to arrival.  Denies any focal medical complaints today, no fevers or recent illness, no chest pain or shortness of breath, no abdominal pain, nausea, vomiting.  She reports she last used heroin yesterday afternoon and injected into her right antecubital fossa, she denies any other substance abuse, does use cigarettes but denies alcohol use.     Past Medical History:  Diagnosis Date  . Endocarditis   . Endometriosis   . Hepatitis C   . Substance abuse [facility])     Patient Active Problem List   Diagnosis Date Noted  . Cellulitis of hand  11/30/2016  . Cellulitis and abscess of hand 11/30/2016  . Heroin abuse (HCC) 11/30/2016  . Hypokalemia 11/30/2016  . Hyponatremia 11/30/2016  . Smoker 11/30/2016  . Sepsis affecting skin 11/30/2016    Past Surgical History:  Procedure Laterality Date  . EXTERNAL FIXATION ARM    . I&D EXTREMITY Left 12/01/2016   Procedure: IRRIGATION AND DEBRIDEMENT EXTREMITY;  Surgeon: Knute Neu, MD;  Location: [facility] OR;  Service: Plastics;  Laterality: Left;     OB History    Gravida  2   Para  2   Term      Preterm      AB      Living  2     SAB      TAB      Ectopic      Multiple      Live Births           Obstetric Comments  IUD removed summer of 2016         Home Medications    Prior to Admission medications   Medication Sig Start Date End Date Taking? Authorizing Provider  AZO-CRANBERRY PO Take 2 tablets by mouth 3 (three) times daily as needed. For pain    [provider]  cyclobenzaprine (FLEXERIL) 5 MG tablet Take 1 tablet (5 mg total) by mouth 3 (three) times daily. 12/04/16   Rolly Salter, MD  dicyclomine (BENTYL) 20 MG tablet Take 1 tablet (20 mg  total) by mouth 3 (three) times daily as needed for spasms. 08/12/17 08/12/18  Merrily Brittle, MD  docusate sodium (COLACE) 100 MG capsule Take 1 capsule (100 mg total) by mouth 2 (two) times daily. 12/04/16   Rolly Salter, MD  HYDROcodone-acetaminophen (NORCO) 5-325 MG tablet Take 1 tablet by mouth every 6 (six) hours as needed for up to 7 doses for severe pain. 08/12/17   Merrily Brittle, MD  ibuprofen (ADVIL,MOTRIN) 600 MG tablet Take 1 tablet (600 mg total) by mouth every 8 (eight) hours as needed. 08/12/17   Merrily Brittle, MD  naproxen (NAPROSYN) 375 MG tablet Take 1 tablet (375 mg total) by mouth 2 (two) times daily. 08/20/17   Felicie Morn, NP  nicotine (NICODERM CQ - DOSED IN MG/24 HOURS) 21 mg/24hr patch Place 1 patch (21 mg total) onto the skin daily. 12/05/16   Rolly Salter, MD  ondansetron  (ZOFRAN ODT) 4 MG disintegrating tablet Take 1 tablet (4 mg total) by mouth every 8 (eight) hours as needed for nausea or vomiting. 08/12/17   Merrily Brittle, MD  traMADol-acetaminophen (ULTRACET) 37.5-325 MG tablet Take 1 tablet by mouth every 6 (six) hours as needed. 12/04/16   Rolly Salter, MD    Family History Family History  Problem Relation Age of Onset  . Emphysema Other     Social History Social History   Tobacco Use  . Smoking status: Current Every Day Smoker    Packs/day: 1.25    Types: Cigarettes  . Smokeless tobacco: Never Used  Substance Use Topics  . Alcohol use: No    Frequency: Never    Comment: occ  . Drug use: Yes    Types: IV    Comment: heroin, "second time I used" 11/28/16     Allergies   Codeine; Penicillins; and Sulfa antibiotics   Review of Systems Review of Systems  Constitutional: Negative for chills and fever.  HENT: Negative.   Eyes: Negative for visual disturbance.  Respiratory: Negative for cough and shortness of breath.   Cardiovascular: Negative for chest pain.  Gastrointestinal: Negative for abdominal pain, nausea and vomiting.  Genitourinary: Negative for dysuria and frequency.  Musculoskeletal: Negative for arthralgias and myalgias.  Skin: Negative for color change, rash and wound.  Neurological: Negative for dizziness, syncope and light-headedness.     Physical Exam Updated Vital Signs BP 123/79 (BP Location: Right Arm)   Pulse 95   Temp (!) 97.5 F (36.4 C) (Oral)   Resp 17   Ht 5\' 3"  (1.6 m)   Wt 63.5 kg   LMP 09/17/2018 (Approximate)   SpO2 100%   BMI 24.80 kg/m   Physical Exam Vitals signs and nursing note reviewed.  Constitutional:      General: She is not in acute distress.    Appearance: Normal appearance. She is well-developed and normal weight. She is not ill-appearing or diaphoretic.  HENT:     Head: Normocephalic and atraumatic.     Mouth/Throat:     Mouth: Mucous membranes are moist.     Pharynx:  Oropharynx is clear.  Eyes:     General:        Right eye: No discharge.        Left eye: No discharge.     Pupils: Pupils are equal, round, and reactive to light.  Neck:     Musculoskeletal: Neck supple.  Cardiovascular:     Rate and Rhythm: Normal rate and regular rhythm.     Pulses: Normal pulses.  Heart sounds: Normal heart sounds. No murmur. No friction rub. No gallop.   Pulmonary:     Effort: Pulmonary effort is normal. No respiratory distress.     Breath sounds: Normal breath sounds. No wheezing or rales.     Comments: Respirations equal and unlabored, patient able to speak in full sentences, lungs clear to auscultation bilaterally Abdominal:     General: Abdomen is flat. Bowel sounds are normal. There is no distension.     Palpations: Abdomen is soft. There is no mass.     Tenderness: There is no abdominal tenderness. There is no guarding.     Comments: Abdomen soft, nondistended, nontender to palpation in all quadrants without guarding or peritoneal signs  Musculoskeletal:        General: No deformity.  Skin:    General: Skin is warm and dry.     Capillary Refill: Capillary refill takes less than 2 seconds.     Comments: Multiple track marks noted on bilateral upper extremities.  Neurological:     Mental Status: She is alert and oriented to person, place, and time.     Coordination: Coordination normal.     Comments: Speech is clear, able to follow commands Moving all extremities with normal coordination intact   Psychiatric:        Mood and Affect: Mood normal.        Behavior: Behavior normal.      ED Treatments / Results  Labs (all labs ordered are listed, but only abnormal results are displayed) Labs Reviewed  COMPREHENSIVE METABOLIC PANEL - Abnormal; Notable for the following components:      Result Value   Potassium 3.3 (*)    Glucose, Bld 106 (*)    Calcium 8.4 (*)    AST 59 (*)    ALT 59 (*)    Total Bilirubin 0.2 (*)    All other components  within normal limits  ACETAMINOPHEN LEVEL - Abnormal; Notable for the following components:   Acetaminophen (Tylenol), Serum <10 (*)    All other components within normal limits  CBC - Abnormal; Notable for the following components:   Hemoglobin 11.1 (*)    HCT 35.0 (*)    MCV 78.0 (*)    MCH 24.7 (*)    All other components within normal limits  RAPID URINE DRUG SCREEN, HOSP PERFORMED - Abnormal; Notable for the following components:   Opiates POSITIVE (*)    All other components within normal limits  ETHANOL  SALICYLATE LEVEL  I-STAT BETA HCG BLOOD, ED ([facility], [facility], [facility] ONLY)    EKG None  Radiology No results found.  Procedures Procedures (including critical care time)  Medications Ordered in ED Medications - No data to display   Initial Impression / Assessment and Plan / ED Course  I have reviewed the triage vital signs and the nursing notes.  Pertinent labs & imaging results that were available during my care of the patient were reviewed by me and considered in my medical decision making (see chart for details).  Patient presents to the emergency department for evaluation of suicidal ideations, reports heroin addiction over the past 7 years and reports that she just wants to give up she feels that she cannot get clean and is useless to her children and has no desire to live.  She denies any specific plan for how she would commit suicide.  She denies any HI or AVH.  Was admitted and treated in the hospital for 3 months this  past year for endocarditis but then began using again.  Denies prior suicide attempts, was previously on antidepressants.  Patient afebrile and overall well-appearing with no focal medical complaints today, denies chest pain or shortness of breath, no abdominal pain, nausea or vomiting, no fevers or chills.  Last used heroin yesterday, track marks noted on bilateral arms.  Will get medical screening labs and EKG and place TTS consult.  EKG without concerning  changes.  Labs overall reassuring, no leukocytosis, stable hemoglobin, mild hypokalemia of 3.3, no other acute electrolyte derangements requiring intervention, normal renal function LFTs minimally elevated which appears to be patient's baseline in the setting of cirrhosis, negative ethanol level, negative acetaminophen and salicylate levels, negative pregnancy, UDS positive for opiates as expected, and otherwise unremarkable.  At this time patient is medically cleared for TTS evaluation, consult has been placed and patient has been placed under ED psych hold.  Awaiting TTS recommendations for disposition.  Final Clinical Impressions(s) / ED Diagnoses   Final diagnoses:  Suicidal thoughts  Substance abuse [facility](HCC)  Hypokalemia    ED Discharge Orders    None       Legrand RamsFord, Kenna Seward N, PA-C 10/14/18 Margretta [id number]    Lorre NickAllen, Anthony, MD 10/15/18 2236

## 2018-10-15 NOTE — ED Notes (Signed)
Pt alert and oriented. Pt denies discomfort or pain at this time. Pt denies si,hi, or avh. Pt cooperative and resting in room. Pt safe, will continue to monitor.

## 2018-10-15 NOTE — ED Notes (Signed)
Patient on the phone now with [facility] for her phone interview.

## 2018-10-15 NOTE — Discharge Instructions (Signed)
To help you maintain a sober lifestyle, a substance abuse treatment program may be beneficial to you.  Contact one of the following facilities at your earliest opportunity to ask about enrolling: ° °RESIDENTIAL PROGRAMS: ° °     [facility] °     [address] °     [place], [place] [address] °     [phone] ° °     [facility] °     [address] °     [place], [place] [address] °     [phone] ° °     [facility] °     [address] °     [place], [place] [address] °     [phone] ° °OUTPATIENT PROGRAMS: ° °     [facility] °     [address] °     [place], [place] [address] °     [phone] °     New patients are seen at the walk-in clinic every Tuesday from 9:00 am - 12:00 pm °

## 2018-10-15 NOTE — BH Assessment (Addendum)
[facility] Assessment Progress Note  Per Juanetta Beets, DO, this pt does not require psychiatric hospitalization at this time.  Pt is to be discharged from [facility] with referral information for area substance abuse treatment providers.  This has been included in pt's discharge instructions.  Pt would also benefit from seeing Peer Support Specialists; they will be asked to speak to pt.  Pt's nurse, Aram Beecham, has been notified.  Doylene Canning, MA Triage Specialist [phone]

## 2018-10-15 NOTE — Patient Outreach (Signed)
Patient was accepted to [facility] substance use treatment facility in [place], [place]. Patient was agreeable to go to [facility] treatment facility. Patient is pleasant and expressed gratitude for the [facility] help with the substance use treatment referral at [facility]. An [facility] staff member will arrive to pick up the patient between 12:15-1:00am tomorrow 10/16/18. CPSS will provide the patient with other substance use treatment resources as well including [facility] contact information and [facility] information.

## 2018-10-15 NOTE — Patient Outreach (Signed)
ED Peer Support Specialist Patient Intake (Complete at intake & 30-60 Day Follow-up)  Name: Maria Abbott  MRN: [id number]  Age: 33 y.o.   Date of Admission: 10/15/2018  Intake: Initial Comments:      Primary Reason Admitted: heroin use, heroin withdrawals  Lab values: Alcohol/ETOH: Negative Positive UDS? Yes Amphetamines: No Barbiturates: No Benzodiazepines: No Cocaine: No Opiates: Yes Cannabinoids: No  Demographic information: Gender: Female Ethnicity: White Marital Status: Single Insurance Status: Uninsured/Self-pay Ecologist (Work Neurosurgeon, Physicist, medical, etc.: No Lives with: Alone Living situation: Homeless  Reported Patient History: Patient reported health conditions: Depression Patient aware of HIV and hepatitis status: Yes (comment)(Hepatitis C)  In past year, has patient visited ED for any reason? Yes  Number of ED visits: 1  Reason(s) for visit: heroin overdose  In past year, has patient been hospitalized for any reason? No  Number of hospitalizations:    Reason(s) for hospitalization:    In past year, has patient been arrested? No  Number of arrests:    Reason(s) for arrest:    In past year, has patient been incarcerated? No  Number of incarcerations:    Reason(s) for incarceration:    In past year, has patient received medication-assisted treatment? No  In past year, patient received the following treatments:    In past year, has patient received any harm reduction services? Yes  Did this include any of the following? Making contact with an SEP([facility])  In past year, has patient received care from a mental health provider for diagnosis other than SUD? No  In past year, is this first time patient has overdosed? No  Number of past overdoses:    In past year, is this first time patient has been hospitalized for an overdose? No(Was brought to the [facility] on 06/07/18 for a heroin  overdose)  Number of hospitalizations for overdose(s):    Is patient currently receiving treatment for a mental health diagnosis? No  Patient reports experiencing difficulty participating in SUD treatment: No    Most important reason(s) for this difficulty?    Has patient received prior services for treatment? No  In past, patient has received services from following agencies:    Plan of Care:  Suggested follow up at these agencies/treatment centers: (Patient looking for a detox bed for her heroin use. CPSS will help with those referrals. )  Other information: CPSS met with the patient to provide help with substance use recovery support and substance use treatment resources. [facility]PSS will help with detox referral to [facility] and [facility]. CPSS will provide substance use treatment resource information for other substance use treatment resources as well including [facility] contact information.    [name], CPSS  10/15/2018 12:12 PM

## 2018-10-15 NOTE — BH Assessment (Signed)
Assessment Note  Maria Abbott is an 33 y.o. female. Pt denies SI/HI and AVH. Pt states she feels hopeless and worthless because of her daily heroin use. Per Pt she uses $60 worth of heroin a day. Pt denies previous SI attempts. Pt denies previous inpatient treatment. Pt states she is seeking inpatient SA treatment.   Dr. Sharma Covert and Fredna Dow, NP recommend D/C and peer support treatment.   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 Endocarditis   . Endometriosis   . Hepatitis C   . Substance abuse [facility])     Past Surgical History:  Procedure Laterality Date  . EXTERNAL FIXATION ARM    . I&D EXTREMITY Left 12/01/2016   Procedure: IRRIGATION AND DEBRIDEMENT EXTREMITY;  Surgeon: Knute Neu, MD;  Location: [facility] OR;  Service: Plastics;  Laterality: Left;    Family History:  Family History  Problem Relation Age of Onset  . Emphysema Other     Social History:  reports that she has been smoking cigarettes. She has been smoking about 1.25 packs per day. She has never used smokeless tobacco. She reports current drug use. Drug: IV. She reports that she does not drink alcohol.  Additional Social History:  Alcohol / Drug Use Pain Medications: please see mar Prescriptions: please see mar Over the Counter: please see mar History of alcohol / drug use?: Yes Longest period of sobriety (when/how long): unknown Negative Consequences of Use: Financial, Legal, Personal relationships, Work / School Substance #1 Name of Substance 1: heroin 1 - Age of First Use: unknown 1 - Amount (size/oz): unknown 1 - Frequency: unknown 1 - Duration: ongoing 1 - Last Use / Amount: 10/14/18  CIWA: CIWA-Ar BP: (!) 119/93 Pulse Rate: 78 COWS:    Allergies:  Allergies  Allergen Reactions  . Codeine Hives and Swelling  . Penicillins Hives and Swelling    Did it involve swelling of the face/tongue/throat, SOB, or low BP? Yes Did it involve  sudden or severe rash/hives, skin peeling, or any reaction on the inside of your mouth or nose? No Did you need to seek medical attention at a hospital or doctor's office? Unknown When did it last happen?Unknown If all above answers are "NO", may proceed with cephalosporin use.   . Sulfa Antibiotics Hives and Swelling    Home Medications: (Not in a hospital admission)   OB/GYN Status:  Patient's last menstrual period was 09/17/2018 (approximate).  General Assessment Data Assessment unable to be completed: Yes Reason for not completing assessment: (patient asleep and unable to be woken up) Location of Assessment: [facility] ED TTS Assessment: In system Is this a Tele or Face-to-Face Assessment?: Face-to-Face Is this an Initial Assessment or a Re-assessment for this encounter?: Initial Assessment Patient Accompanied by:: N/A Language Other than English: No Living Arrangements: Homeless/Shelter What gender do you identify as?: Female Marital status: Single Maiden name: NA Pregnancy Status: No Living Arrangements: Other (Comment) Can pt return to current living arrangement?: No Admission Status: Voluntary Is patient capable of signing voluntary admission?: Yes Referral Source: Self/Family/Friend Insurance type: SP     Crisis Care Plan Living Arrangements: Other (Comment) Legal Guardian: Other:(self) Name of Psychiatrist: NA Name of Therapist: NA  Education Status Is patient currently in school?: No Is the patient employed, unemployed or receiving disability?: Unemployed  Risk to self with the past 6 months Suicidal Ideation: No-Not Currently/Within Last 6 Months Has patient been a risk to self within the  past 6 months prior to admission? : No Suicidal Intent: No Has patient had any suicidal intent within the past 6 months prior to admission? : No Is patient at risk for suicide?: No Suicidal Plan?: No Has patient had any suicidal plan within the past 6 months prior to  admission? : No Access to Means: No What has been your use of drugs/alcohol within the last 12 months?: NA Previous Attempts/Gestures: No How many times?: 0 Other Self Harm Risks: NA Triggers for Past Attempts: None known Intentional Self Injurious Behavior: None Family Suicide History: No Recent stressful life event(s): Other (Comment)(SA) Persecutory voices/beliefs?: No Depression: Yes Depression Symptoms: Tearfulness, Isolating, Loss of interest in usual pleasures, Feeling worthless/self pity, Feeling angry/irritable Substance abuse history and/or treatment for substance abuse?: Yes Suicide prevention information given to non-admitted patients: Not applicable  Risk to Others within the past 6 months Homicidal Ideation: No Does patient have any lifetime risk of violence toward others beyond the six months prior to admission? : No Thoughts of Harm to Others: No Current Homicidal Intent: No Current Homicidal Plan: No Access to Homicidal Means: No Identified Victim: NA History of harm to others?: No Assessment of Violence: None Noted Violent Behavior Description: NA Does patient have access to weapons?: No Criminal Charges Pending?: No Does patient have a court date: No Is patient on probation?: No  Psychosis Hallucinations: None noted Delusions: None noted  Mental Status Report Appearance/Hygiene: Unremarkable Eye Contact: Poor Motor Activity: Freedom of movement Speech: Logical/coherent Level of Consciousness: Alert Mood: Depressed Affect: Depressed Anxiety Level: Minimal Thought Processes: Relevant, Coherent Judgement: Unimpaired Orientation: Person, Place, Time, Situation Obsessive Compulsive Thoughts/Behaviors: None  Cognitive Functioning Concentration: Normal Memory: Recent Intact, Remote Intact Is patient IDD: No Insight: Poor Impulse Control: Poor Appetite: Fair Have you had any weight changes? : No Change Sleep: Decreased Total Hours of Sleep:  6 Vegetative Symptoms: None  ADLScreening [facility] Assessment Services) Patient's cognitive ability adequate to safely complete daily activities?: Yes Patient able to express need for assistance with ADLs?: Yes Independently performs ADLs?: Yes (appropriate for developmental age)  Prior Inpatient Therapy Prior Inpatient Therapy: No  Prior Outpatient Therapy Prior Outpatient Therapy: No Does patient have an ACCT team?: No Does patient have Intensive In-House Services?  : No Does patient have [facility] services? : No Does patient have P4CC services?: No  ADL Screening (condition at time of admission) Patient's cognitive ability adequate to safely complete daily activities?: Yes Is the patient deaf or have difficulty hearing?: No Does the patient have difficulty seeing, even when wearing glasses/contacts?: No Does the patient have difficulty concentrating, remembering, or making decisions?: No Patient able to express need for assistance with ADLs?: Yes Does the patient have difficulty dressing or bathing?: No Independently performs ADLs?: Yes (appropriate for developmental age) Does the patient have difficulty walking or climbing stairs?: No       Abuse/Neglect Assessment (Assessment to be complete while patient is alone) Abuse/Neglect Assessment Can Be Completed: Yes Physical Abuse: Denies Verbal Abuse: Denies Sexual Abuse: Denies Exploitation of patient/patient's resources: Denies     Merchant navy officer (For Healthcare) Does Patient Have a Medical Advance Directive?: No Would patient like information on creating a medical advance directive?: No - Patient declined          Disposition:  Disposition Initial Assessment Completed for this Encounter: Yes  On Site Evaluation by:   Reviewed with Physician:    Emmit Pomfret 10/15/2018 7:49 AM

## 2018-10-15 NOTE — ED Notes (Signed)
Patient accepted at [facility].  [facility] staff to pick up patient at 12:15AM.  Patient notified by Jonny Ruiz, peer support.

## 2018-10-15 NOTE — ED Notes (Signed)
TTS unable to arouse 2000, TTS will continue to attempt to complete TTS assessment with patient.  NEEDS TO BE SEEN. Clock stopped- patient unresponsive to multiple prompts to wake up at time of assessment 1810 per prior shift.

## 2018-10-15 NOTE — Patient Outreach (Signed)
Patient just completed the on-phone screening for detox at [facility]. An [facility] staff member will call back in hour to update [facility] whether or not she is accepted into their detox program.

## 2018-10-16 NOTE — ED Notes (Signed)
Pt discharge instructions reviewed with pt. Pt notes an understanding. Pt belongings given to pt . Pt walked out to main ed. Pt transported to [facility].

## 2019-07-14 ENCOUNTER — Other Ambulatory Visit: Payer: Self-pay

## 2019-07-14 ENCOUNTER — Encounter: Payer: Self-pay | Admitting: *Deleted

## 2019-07-14 DIAGNOSIS — K8020 Calculus of gallbladder without cholecystitis without obstruction: Secondary | ICD-10-CM | POA: Diagnosis not present

## 2019-07-14 DIAGNOSIS — Z79899 Other long term (current) drug therapy: Secondary | ICD-10-CM | POA: Insufficient documentation

## 2019-07-14 DIAGNOSIS — K8050 Calculus of bile duct without cholangitis or cholecystitis without obstruction: Secondary | ICD-10-CM | POA: Diagnosis not present

## 2019-07-14 DIAGNOSIS — R1011 Right upper quadrant pain: Secondary | ICD-10-CM | POA: Diagnosis present

## 2019-07-14 DIAGNOSIS — F17210 Nicotine dependence, cigarettes, uncomplicated: Secondary | ICD-10-CM | POA: Diagnosis not present

## 2019-07-14 LAB — COMPREHENSIVE METABOLIC PANEL
ALT: 59 U/L — ABNORMAL HIGH (ref 0–44)
AST: 62 U/L — ABNORMAL HIGH (ref 15–41)
Albumin: 4.1 g/dL (ref 3.5–5.0)
Alkaline Phosphatase: 63 U/L (ref 38–126)
Anion gap: 10 (ref 5–15)
BUN: 21 mg/dL — ABNORMAL HIGH (ref 6–20)
CO2: 23 mmol/L (ref 22–32)
Calcium: 9 mg/dL (ref 8.9–10.3)
Chloride: 103 mmol/L (ref 98–111)
Creatinine, Ser: 0.91 mg/dL (ref 0.44–1.00)
GFR calc Af Amer: >60 mL/min (ref >60)
GFR calc non Af Amer: >60 mL/min (ref >60)
Glucose, Bld: 97 mg/dL (ref 70–99)
Potassium: 3.8 mmol/L (ref 3.5–5.1)
Sodium: 136 mmol/L (ref 135–145)
Total Bilirubin: 0.7 mg/dL (ref 0.3–1.2)
Total Protein: 8.7 g/dL — ABNORMAL HIGH (ref 6.5–8.1)

## 2019-07-14 LAB — URINALYSIS, COMPLETE (UACMP) WITH MICROSCOPIC
Bacteria, UA: NONE SEEN
Bilirubin Urine: NEGATIVE
Glucose, UA: NEGATIVE mg/dL
Hgb urine dipstick: NEGATIVE
Ketones, ur: NEGATIVE mg/dL
Nitrite: NEGATIVE
Protein, ur: 100 mg/dL — AB
Specific Gravity, Urine: 1.027 (ref 1.005–1.030)
pH: 5 (ref 5.0–8.0)

## 2019-07-14 LAB — CBC
HCT: 37.4 % (ref 36.0–46.0)
Hemoglobin: 12.8 g/dL (ref 12.0–15.0)
MCH: 25.5 pg — ABNORMAL LOW (ref 26.0–34.0)
MCHC: 34.2 g/dL (ref 30.0–36.0)
MCV: 74.7 fL — ABNORMAL LOW (ref 80.0–100.0)
Platelets: 326 10*3/uL (ref 150–400)
RBC: 5.01 MIL/uL (ref 3.87–5.11)
RDW: 14.8 % (ref 11.5–15.5)
WBC: 10.2 10*3/uL (ref 4.0–10.5)
nRBC: 0 % (ref 0.0–0.2)

## 2019-07-14 LAB — POCT PREGNANCY, URINE: Preg Test, Ur: NEGATIVE

## 2019-07-14 LAB — LIPASE, BLOOD: Lipase: 24 U/L (ref 11–51)

## 2019-07-14 MED ORDER — SODIUM CHLORIDE 0.9% FLUSH
3.0000 mL | Freq: Once | INTRAVENOUS | Status: AC
  Administered 2019-07-15: 01:00:00 3 mL via INTRAVENOUS

## 2019-07-14 NOTE — ED Triage Notes (Signed)
Pt to triage via wheelchair.  Pt has right upper quad abd pain for 3 days.  Pt states I need my gallbladder out.  No v/d.  Pt alert  Speech clear.

## 2019-07-15 ENCOUNTER — Emergency Department
Admission: EM | Admit: 2019-07-15 | Discharge: 2019-07-15 | Disposition: A | Discharge status: Home / Self Care | Payer: Medicaid Other | Attending: Emergency Medicine | Admitting: Emergency Medicine

## 2019-07-15 ENCOUNTER — Emergency Department: Payer: Medicaid Other

## 2019-07-15 DIAGNOSIS — K8020 Calculus of gallbladder without cholecystitis without obstruction: Secondary | ICD-10-CM

## 2019-07-15 DIAGNOSIS — K8050 Calculus of bile duct without cholangitis or cholecystitis without obstruction: Secondary | ICD-10-CM

## 2019-07-15 LAB — URINE DRUG SCREEN, QUALITATIVE (ARMC ONLY)
Amphetamines, Ur Screen: POSITIVE — AB
Barbiturates, Ur Screen: NOT DETECTED
Benzodiazepine, Ur Scrn: NOT DETECTED
Cannabinoid 50 Ng, Ur ~~LOC~~: NOT DETECTED
Cocaine Metabolite,Ur ~~LOC~~: NOT DETECTED
MDMA (Ecstasy)Ur Screen: NOT DETECTED
Methadone Scn, Ur: NOT DETECTED
Opiate, Ur Screen: POSITIVE — AB
Phencyclidine (PCP) Ur S: NOT DETECTED
Tricyclic, Ur Screen: NOT DETECTED

## 2019-07-15 MED ORDER — ONDANSETRON 4 MG PO TBDP: ORAL_TABLET | ORAL | 0 refills | Status: AC

## 2019-07-15 MED ORDER — DROPERIDOL 2.5 MG/ML IJ SOLN
2.5000 mg | Freq: Once | INTRAMUSCULAR | Status: AC
  Administered 2019-07-15: 2.5 mg via INTRAVENOUS
  Filled 2019-07-15: qty 2

## 2019-07-15 MED ORDER — SODIUM CHLORIDE 0.9 % IV BOLUS
1000.0000 mL | Freq: Once | INTRAVENOUS | Status: AC
  Administered 2019-07-15: 1000 mL via INTRAVENOUS

## 2019-07-15 MED ORDER — KETOROLAC TROMETHAMINE 30 MG/ML IJ SOLN
15.0000 mg | Freq: Once | INTRAMUSCULAR | Status: AC
  Administered 2019-07-15: 15 mg via INTRAVENOUS
  Filled 2019-07-15: qty 1

## 2019-07-15 MED ORDER — MORPHINE SULFATE (PF) 4 MG/ML IV SOLN: 4.0000 mg | Freq: Once | INTRAVENOUS | Status: DC

## 2019-07-15 MED ORDER — ONDANSETRON HCL 4 MG/2ML IJ SOLN: 4.0000 mg | INTRAMUSCULAR | Status: DC

## 2019-07-15 NOTE — Discharge Instructions (Signed)
You have been seen in the Emergency Department (ED) for abdominal pain.  Your evaluation suggests that your pain is caused by gallstones.  Fortunately you do not need immediate surgery at this time, but it is important that you follow up with a surgeon as an outpatient; typically surgical removal of the gallbladder is the only thing that will definitively fix your issue.  Read through the included information about a bland diet, and use any prescribed medications as instructed.  Avoid smoking and alcohol use.  Please follow up as instructed above regarding today's emergent visit and the symptoms that are bothering you.  Return to the ED if your abdominal pain worsens or fails to improve, you develop bloody vomiting, bloody diarrhea, you are unable to tolerate fluids due to vomiting, fever greater than 101, or other symptoms that concern you. 

## 2019-07-15 NOTE — ED Notes (Signed)
ED Provider at bedside. 

## 2019-07-15 NOTE — ED Notes (Signed)
[facility] tech in room.

## 2019-07-15 NOTE — ED Notes (Signed)
Patient currently resting with eyes closed. Will continue to monitor.  

## 2019-07-15 NOTE — ED Provider Notes (Signed)
[facility] Emergency Department Provider Note  ____________________________________________   First MD Initiated Contact with Patient 07/15/19 0026     (approximate)  I have reviewed the triage vital signs and the nursing notes.   HISTORY  Chief Complaint Abdominal Pain    HPI Maria Abbott is a 33 y.o. female with medical history as listed below which notably includes substance abuse and prior diagnosis of gallstones.  She presents tonight for 3 days of which she describes as constant sharp and aching pain in the right upper quadrant of her abdomen, occasionally radiating through to her back.  She has had nausea and at least 2 episodes of vomiting.  Eating makes it worse and nothing in particular makes it better.  She has no insurance so she has not been able to have her gallbladder out after being diagnosed with gallstones "years" ago.  She said that she uses heroin and meth and that she last used heroin yesterday.  She denies contact with COVID-19 patients.  She denies fever/chills, sore throat, chest pain, shortness of breath, and dysuria.  She describes the symptoms as severe.         Past Medical History:  Diagnosis Date  . Endocarditis   . Endometriosis   . Hepatitis C   . Substance abuse [facility])     Patient Active Problem List   Diagnosis Date Noted  . Cellulitis of hand 11/30/2016  . Cellulitis and abscess of hand 11/30/2016  . Heroin abuse ([place]) 11/30/2016  . Hypokalemia 11/30/2016  . Hyponatremia 11/30/2016  . Smoker 11/30/2016  . Sepsis affecting skin 11/30/2016    Past Surgical History:  Procedure Laterality Date  . EXTERNAL FIXATION ARM    . I&D EXTREMITY Left 12/01/2016   Procedure: IRRIGATION AND DEBRIDEMENT EXTREMITY;  Surgeon: Dayna Barker, MD;  Location: [place];  Service: Plastics;  Laterality: Left;    Prior to Admission medications   Medication Sig Start Date End Date Taking? Authorizing Provider  AZO-CRANBERRY PO  Take 2 tablets by mouth 3 (three) times daily as needed. For pain    [provider]  cyclobenzaprine (FLEXERIL) 5 MG tablet Take 1 tablet (5 mg total) by mouth 3 (three) times daily. 12/04/16   Lavina Hamman, MD  dicyclomine (BENTYL) 20 MG tablet Take 1 tablet (20 mg total) by mouth 3 (three) times daily as needed for spasms. 08/12/17 08/12/18  Darel Hong, MD  diphenhydrAMINE (BENADRYL ALLERGY) 25 MG tablet Take 50 mg by mouth daily.    [provider]  docusate sodium (COLACE) 100 MG capsule Take 1 capsule (100 mg total) by mouth 2 (two) times daily. 12/04/16   Lavina Hamman, MD  HYDROcodone-acetaminophen (NORCO) 5-325 MG tablet Take 1 tablet by mouth every 6 (six) hours as needed for up to 7 doses for severe pain. 08/12/17   Darel Hong, MD  ibuprofen (ADVIL,MOTRIN) 600 MG tablet Take 1 tablet (600 mg total) by mouth every 8 (eight) hours as needed. 08/12/17   Darel Hong, MD  naproxen (NAPROSYN) 375 MG tablet Take 1 tablet (375 mg total) by mouth 2 (two) times daily. 08/20/17   Etta Quill, NP  nicotine (NICODERM CQ - DOSED IN MG/24 HOURS) 21 mg/24hr patch Place 1 patch (21 mg total) onto the skin daily. 12/05/16   Lavina Hamman, MD  ondansetron (ZOFRAN ODT) 4 MG disintegrating tablet Allow 1-2 tablets to dissolve in your mouth every 8 hours as needed for nausea/vomiting 07/15/19   Hinda Kehr, MD  POTASSIUM PO Take 2 tablets by mouth daily.    [provider]  traMADol-acetaminophen (ULTRACET) 37.5-325 MG tablet Take 1 tablet by mouth every 6 (six) hours as needed. 12/04/16   Rolly SalterPatel, Pranav M, MD    Allergies Codeine, Penicillins, and Sulfa antibiotics  Family History  Problem Relation Age of Onset  . Emphysema Other     Social History Social History   Tobacco Use  . Smoking status: Current Every Day Smoker    Packs/day: 1.25    Types: Cigarettes  . Smokeless tobacco: Never Used  Substance Use Topics  . Alcohol use: No    Frequency:  Never    Comment: occ  . Drug use: Yes    Types: IV    Comment: heroin, "second time I used" 11/28/16    Review of Systems Constitutional: No fever/chills Eyes: No visual changes. ENT: No sore throat. Cardiovascular: Denies chest pain. Respiratory: Denies shortness of breath. Gastrointestinal: Right upper quadrant abdominal pain with nausea and vomiting for 3 days. Genitourinary: Negative for dysuria. Musculoskeletal: Negative for neck pain.  Negative for back pain. Integumentary: Negative for rash. Neurological: Negative for headaches, focal weakness or numbness. Psych:  polysubstance abuse  ____________________________________________   PHYSICAL EXAM:  VITAL SIGNS: ED Triage Vitals  Enc Vitals Group     BP 07/14/19 2105 123/69     Pulse Rate 07/14/19 2105 99     Resp 07/14/19 2105 18     Temp 07/14/19 2105 98.3 F (36.8 C)     Temp Source 07/14/19 2105 Oral     SpO2 07/14/19 2105 99 %     Weight 07/14/19 2106 59 kg (130 lb)     Height 07/14/19 2106 1.575 m (5\' 2" )     Head Circumference --      Peak Flow --      Pain Score 07/14/19 2106 8     Pain Loc --      Pain Edu? --      Excl. in GC? --     Constitutional: Alert and oriented.  Appears uncomfortable. Eyes: Conjunctivae are normal.  Head: Atraumatic. Nose: No congestion/rhinnorhea. Mouth/Throat: Patient is wearing a mask. Neck: No stridor.  No meningeal signs.   Cardiovascular: Normal rate, regular rhythm. Good peripheral circulation. Grossly normal heart sounds. Respiratory: Normal respiratory effort.  No retractions. Gastrointestinal: Soft and nondistended.  No lower abdominal tenderness to palpation.  Severe tenderness to palpation of the epigastrium and right upper quadrant with positive Murphy sign. Musculoskeletal: No lower extremity tenderness nor edema. No gross deformities of extremities. Neurologic:  Normal speech and language. No gross focal neurologic deficits are appreciated.  Skin:  Skin is  warm and dry.  She has multiple sores on various parts of her arms consistent with the picking behavior seen in stimulant abusers. Psychiatric: The patient is in near constant motion, twitching, showing signs of pain from the abdomen but also symptoms suggestive of either drug use or withdrawal symptoms.  ____________________________________________   LABS (all labs ordered are listed, but only abnormal results are displayed)  Labs Reviewed  COMPREHENSIVE METABOLIC PANEL - Abnormal; Notable for the following components:      Result Value   BUN 21 (*)    Total Protein 8.7 (*)    AST 62 (*)    ALT 59 (*)    All other components within normal limits  CBC - Abnormal; Notable for the following components:   MCV 74.7 (*)    MCH 25.5 (*)  All other components within normal limits  URINALYSIS, COMPLETE (UACMP) WITH MICROSCOPIC - Abnormal; Notable for the following components:   Color, Urine YELLOW (*)    APPearance CLOUDY (*)    Protein, ur 100 (*)    Leukocytes,Ua TRACE (*)    All other components within normal limits  URINE DRUG SCREEN, QUALITATIVE ([facility] ONLY) - Abnormal; Notable for the following components:   Amphetamines, Ur Screen POSITIVE (*)    Opiate, Ur Screen POSITIVE (*)    All other components within normal limits  LIPASE, BLOOD  POC URINE PREG, ED  POCT PREGNANCY, URINE   ____________________________________________  EKG  Old EKG reviewed and QTc interval was about 415 ms.  No indication for emergent EKG tonight. ____________________________________________  RADIOLOGY Marylou Mccoy, personally viewed and evaluated these images (plain radiographs) as part of my medical decision making, as well as reviewing the written report by the radiologist.  ED MD interpretation:  Numerous gallstones without clear evidence of cholecystitis  Official radiology report(s): US Abdomen Limited Ruq  Result Date: 07/15/2019 CLINICAL DATA:  33 year old female with right upper  quadrant abdominal pain. EXAM: ULTRASOUND ABDOMEN LIMITED RIGHT UPPER QUADRANT COMPARISON:  Ultrasound dated 08/12/2017 FINDINGS: Gallbladder: The gallbladder is filled with stones with sonographic appearance of wall echo shadow. Evaluation of the gallbladder wall is somewhat limited due to suboptimal visualization. The gallbladder wall however is upper limits of normal thickness measuring approximately 3-4 mm. No definite pericholecystic fluid. Evaluation for Murphy's sign was limited as the patient was medicated. Common bile duct: Diameter: 4 mm Liver: The liver is unremarkable. Portal vein is patent on color Doppler imaging with normal direction of blood flow towards the liver. Other: None. IMPRESSION: Stone filled gallbladder. No definite sonographic evidence of acute cholecystitis. Evaluation for acute cholecystitis however limited on this ultrasound. A hepatobiliary scintigraphy may provide better evaluation of the gallbladder if there is a high clinical concern for acute cholecystitis . Electronically Signed   By: Elgie Collard M.D.   On: 07/15/2019 02:21    ____________________________________________   PROCEDURES   Procedure(s) performed (including Critical Care):  Procedures   ____________________________________________   INITIAL IMPRESSION / MDM / ASSESSMENT AND PLAN / ED COURSE  As part of my medical decision making, I reviewed the following data within the electronic MEDICAL RECORD NUMBER Nursing notes reviewed and incorporated, Labs reviewed , Old chart reviewed, Notes from prior ED visits and [place] Controlled Substance Database   Differential diagnosis includes, but is not limited to, biliary disease, pancreatitis, substance withdrawal.  In spite of her honesty about her substance abuse, the patient does not appear to be drug-seeking.  She appears to be in genuine discomfort and she is reporting history of gallstones.  Her labs are reassuring tonight with no LFT elevation and no  elevation of her lipase but she is tender in the epigastrium and right upper quadrant.  Given her obvious discomfort but also given her history of narcotics abuse, I will try to make her feel better with a dose of Toradol 15 mg IV and droperidol 2.5 mg IV which should help with the pain and the nausea.  She is getting a liter of fluids and we will obtain an ultrasound of the right upper quadrant.      Clinical Course as of Jul 15 435  Wed Jul 15, 2019  0305 Opiate, Ur Screen(!): POSITIVE [CF]  0305 Amphetamines, Ur Screen(!): POSITIVE [CF]  0435 Urine does not appear to be infected, likely contaminated with squamous  cells, and the patient is not having any dysuria so I will not treat empirically  Urinalysis, Complete w Microscopic(!) [CF]  0435 Numerous gallstones but without clear evidence of cholecystitis.  Radiology mentioned correlating clinically to see if advanced imaging is necessary, but I reassessed the patient and she has been sleeping comfortably.  When I woke her up she said that she feels much better.  She has no evidence of LFT elevation or an elevated lipase and I do not believe she has acute cholecystitis.  I advised her that she needs to follow-up with surgery and I gave her my usual customary return precautions and she understands and agrees with the plan.  I warned her about additional illicit drug use and how that may make her ineligible for elective surgeries and she states that she understands.  US ABDOMEN LIMITED RUQ [CF]    Clinical Course User Index [CF] Loleta Rose, MD     ____________________________________________  FINAL CLINICAL IMPRESSION(S) / ED DIAGNOSES  Final diagnoses:  Biliary colic  Gallstones     MEDICATIONS GIVEN DURING THIS VISIT:  Medications  sodium chloride flush (NS) 0.9 % injection 3 mL (3 mLs Intravenous Given 07/15/19 0126)  ketorolac (TORADOL) 30 MG/ML injection 15 mg (15 mg Intravenous Given 07/15/19 0124)  sodium chloride 0.9 %  bolus 1,000 mL (0 mLs Intravenous Stopped 07/15/19 0210)  droperidol (INAPSINE) 2.5 MG/ML injection 2.5 mg (2.5 mg Intravenous Given 07/15/19 0124)     ED Discharge Orders         Ordered    ondansetron (ZOFRAN ODT) 4 MG disintegrating tablet     07/15/19 0434          *Please note:  Maria Abbott was evaluated in Emergency Department on 07/15/2019 for the symptoms described in the history of present illness. She was evaluated in the context of the global COVID-19 pandemic, which necessitated consideration that the patient might be at risk for infection with the SARS-CoV-2 virus that causes COVID-19. Institutional protocols and algorithms that pertain to the evaluation of patients at risk for COVID-19 are in a state of rapid change based on information released by regulatory bodies including the CDC and federal and state organizations. These policies and algorithms were followed during the patient's care in the ED.  Some ED evaluations and interventions may be delayed as a result of limited staffing during the pandemic.*  Note:  This document was prepared using Dragon voice recognition software and may include unintentional dictation errors.   Loleta Rose, MD 07/15/19 [phone]

## 2019-07-15 NOTE — ED Notes (Signed)
Patient states she is having a flare. Patient states she has not had  A flare up of the abd pain in a year or so. Patient admits to using Heroin yesterday. Patient states she has been using off and on for 10 years.

## 2019-09-30 ENCOUNTER — Emergency Department (HOSPITAL_COMMUNITY)
Admission: EM | Admit: 2019-09-30 | Discharge: 2019-10-01 | Disposition: A | Discharge status: Home / Self Care | Payer: Medicaid Other | Attending: Emergency Medicine | Admitting: Emergency Medicine

## 2019-09-30 ENCOUNTER — Encounter (HOSPITAL_COMMUNITY): Payer: Self-pay | Admitting: Emergency Medicine

## 2019-09-30 ENCOUNTER — Other Ambulatory Visit: Payer: Self-pay

## 2019-09-30 DIAGNOSIS — F329 Major depressive disorder, single episode, unspecified: Secondary | ICD-10-CM | POA: Diagnosis not present

## 2019-09-30 DIAGNOSIS — F11229 Opioid dependence with intoxication, unspecified: Secondary | ICD-10-CM | POA: Insufficient documentation

## 2019-09-30 DIAGNOSIS — F32A Depression, unspecified: Secondary | ICD-10-CM

## 2019-09-30 DIAGNOSIS — T401X1A Poisoning by heroin, accidental (unintentional), initial encounter: Secondary | ICD-10-CM | POA: Diagnosis present

## 2019-09-30 DIAGNOSIS — F17210 Nicotine dependence, cigarettes, uncomplicated: Secondary | ICD-10-CM | POA: Insufficient documentation

## 2019-09-30 LAB — COMPREHENSIVE METABOLIC PANEL
ALT: 116 U/L — ABNORMAL HIGH (ref 0–44)
AST: 113 U/L — ABNORMAL HIGH (ref 15–41)
Albumin: 4.1 g/dL (ref 3.5–5.0)
Alkaline Phosphatase: 78 U/L (ref 38–126)
Anion gap: 12 (ref 5–15)
BUN: 13 mg/dL (ref 6–20)
CO2: 25 mmol/L (ref 22–32)
Calcium: 9.1 mg/dL (ref 8.9–10.3)
Chloride: 99 mmol/L (ref 98–111)
Creatinine, Ser: 0.83 mg/dL (ref 0.44–1.00)
GFR calc Af Amer: >60 mL/min (ref >60)
GFR calc non Af Amer: >60 mL/min (ref >60)
Glucose, Bld: 85 mg/dL (ref 70–99)
Potassium: 3.6 mmol/L (ref 3.5–5.1)
Sodium: 136 mmol/L (ref 135–145)
Total Bilirubin: 0.6 mg/dL (ref 0.3–1.2)
Total Protein: 8.4 g/dL — ABNORMAL HIGH (ref 6.5–8.1)

## 2019-09-30 LAB — I-STAT BETA HCG BLOOD, ED (MC, WL, AP ONLY): I-stat hCG, quantitative: <5 m[IU]/mL (ref <5)

## 2019-09-30 LAB — CBC WITH DIFFERENTIAL/PLATELET
Abs Immature Granulocytes: 0.02 10*3/uL (ref 0.00–0.07)
Basophils Absolute: 0 10*3/uL (ref 0.0–0.1)
Basophils Relative: 0 %
Eosinophils Absolute: 0.1 10*3/uL (ref 0.0–0.5)
Eosinophils Relative: 2 %
HCT: 39.2 % (ref 36.0–46.0)
Hemoglobin: 13.3 g/dL (ref 12.0–15.0)
Immature Granulocytes: 0 %
Lymphocytes Relative: 24 %
Lymphs Abs: 1.8 10*3/uL (ref 0.7–4.0)
MCH: 26.3 pg (ref 26.0–34.0)
MCHC: 33.9 g/dL (ref 30.0–36.0)
MCV: 77.5 fL — ABNORMAL LOW (ref 80.0–100.0)
Monocytes Absolute: 0.5 10*3/uL (ref 0.1–1.0)
Monocytes Relative: 7 %
Neutro Abs: 4.9 10*3/uL (ref 1.7–7.7)
Neutrophils Relative %: 67 %
Platelets: 306 10*3/uL (ref 150–400)
RBC: 5.06 MIL/uL (ref 3.87–5.11)
RDW: 13.5 % (ref 11.5–15.5)
WBC: 7.4 10*3/uL (ref 4.0–10.5)
nRBC: 0 % (ref 0.0–0.2)

## 2019-09-30 LAB — ETHANOL: Alcohol, Ethyl (B): <10 mg/dL

## 2019-09-30 NOTE — ED Notes (Signed)
ED Provider at bedside. 

## 2019-09-30 NOTE — ED Triage Notes (Signed)
Pt BIBA from [facility] bathroom. Pt found in resp arrest. Pinpoint pupils. Pt received 2mg  narcan IN prior to EMS arrival. Pt c/o headache, nausea, back pain.

## 2019-09-30 NOTE — ED Provider Notes (Signed)
[facility]-EMERGENCY DEPT [facility] Emergency Department Provider Note MRN:  [id number]  Arrival date & time: 09/30/19     Chief Complaint   Drug Overdose   History of Present Illness   Maria Abbott is a 34 y.o. year-old female with a history of substance abuse presenting to the ED with chief complaint of drug overdose.  Found in respiratory arrest at [facility] bathroom, pinpoint pupils, given intranasal Narcan with improvement.  Patient explains that she had been clean for a few days but unfortunately relapsed today.  Explains that she was not trying to harm herself, and she only took a small dose.  This was a new batch of heroin that she injected today.  She is endorsing some mild tailbone pain, thinks that she slipped off of the toilet and fell onto her bottom.  Review of Systems  A complete 10 system review of systems was obtained and all systems are negative except as noted in the HPI and PMH.   Patient's Health History    Past Medical History:  Diagnosis Date  . Endocarditis   . Endometriosis   . Hepatitis C   . Substance abuse [facility])     Past Surgical History:  Procedure Laterality Date  . EXTERNAL FIXATION ARM    . I & D EXTREMITY Left 12/01/2016   Procedure: IRRIGATION AND DEBRIDEMENT EXTREMITY;  Surgeon: Knute Neu, MD;  Location: [facility] OR;  Service: Plastics;  Laterality: Left;    Family History  Problem Relation Age of Onset  . Emphysema Other     Social History   Socioeconomic History  . Marital status: Single    Spouse name: Not on file  . Number of children: Not on file  . Years of education: Not on file  . Highest education level: Not on file  Occupational History  . Not on file  Tobacco Use  . Smoking status: Current Every Day Smoker    Packs/day: 1.25    Types: Cigarettes  . Smokeless tobacco: Never Used  Substance and Sexual Activity  . Alcohol use: No    Comment: occ  . Drug use: Yes    Types: IV  . Sexual activity: Yes    Birth  control/protection: None  Other Topics Concern  . Not on file  Social History Narrative  . Not on file   Social Determinants of Health   Financial Resource Strain:   . Difficulty of Paying Living Expenses: Not on file  Food Insecurity:   . Worried About Programme researcher, broadcasting/film/video in the Last Year: Not on file  . Ran Out of Food in the Last Year: Not on file  Transportation Needs:   . Lack of Transportation (Medical): Not on file  . Lack of Transportation (Non-Medical): Not on file  Physical Activity:   . Days of Exercise per Week: Not on file  . Minutes of Exercise per Session: Not on file  Stress:   . Feeling of Stress : Not on file  Social Connections:   . Frequency of Communication with Friends and Family: Not on file  . Frequency of Social Gatherings with Friends and Family: Not on file  . Attends Religious Services: Not on file  . Active Member of Clubs or Organizations: Not on file  . Attends Banker Meetings: Not on file  . Marital Status: Not on file  Intimate Partner Violence:   . Fear of Current or Ex-Partner: Not on file  . Emotionally Abused: Not on file  .  Physically Abused: Not on file  . Sexually Abused: Not on file     Physical Exam  Vital Signs and Nursing Notes reviewed Vitals:   09/30/19 1212 09/30/19 1218  BP:  (!) 127/97  Pulse:  95  Resp:  (!) 25  Temp:  98.3 F (36.8 C)  SpO2: 100% 100%    CONSTITUTIONAL: Well-appearing, NAD NEURO:  Alert and oriented x 3, no focal deficits EYES:  eyes equal and reactive ENT/NECK:  no LAD, no JVD CARDIO: Regular rate, well-perfused, normal S1 and S2 PULM:  CTAB no wheezing or rhonchi GI/GU:  normal bowel sounds, non-distended, non-tender MSK/SPINE:  No gross deformities, no edema SKIN:  no rash, atraumatic, multiple track marks to bilateral arms PSYCH: Depressed speech and behavior  Diagnostic and Interventional Summary    EKG Interpretation  Date/Time:  Wednesday September 30 2019 12:21:09  EST Ventricular Rate:  93 PR Interval:    QRS Duration: 99 QT Interval:  362 QTC Calculation: 451 R Axis:   92 Text Interpretation: Sinus rhythm Borderline right axis deviation Confirmed by Kennis Carina [phone]) on 09/30/2019 1:09:42 PM      Labs Reviewed  COMPREHENSIVE METABOLIC PANEL - Abnormal; Notable for the following components:      Result Value   Total Protein 8.4 (*)    AST 113 (*)    ALT 116 (*)    All other components within normal limits  CBC WITH DIFFERENTIAL/PLATELET - Abnormal; Notable for the following components:   MCV 77.5 (*)    All other components within normal limits  ETHANOL  RAPID URINE DRUG SCREEN, HOSP PERFORMED  I-STAT BETA HCG BLOOD, ED ([facility], [facility], [facility] ONLY)    No orders to display    Medications - No data to display   Procedures  /  Critical Care .Critical Care Performed by: Sabas Sous, MD Authorized by: Sabas Sous, MD   Critical care provider statement:    Critical care time (minutes):  45   Critical care was necessary to treat or prevent imminent or life-threatening deterioration of the following conditions: Opioid overdose.   Critical care was time spent personally by me on the following activities:  Evaluation of patient's response to treatment, examination of patient, ordering and performing treatments and interventions, ordering and review of laboratory studies, ordering and review of radiographic studies, pulse oximetry, re-evaluation of patient's condition, obtaining history from patient or surrogate and review of old charts    ED Course and Medical Decision Making  I have reviewed the triage vital signs, the nursing notes, and pertinent available records from the EMR.  Pertinent labs & imaging results that were available during my care of the patient were reviewed by me and considered in my medical decision making (see below for details).     Concern for opioid overdose, possibly fentanyl contamination given patient's  explanation that she only took a small dose and this was a new batch.  Will need to monitor for a few hours to ensure no resedation.  Patient seems depressed, hopeless, would benefit from TTS evaluation.  No significant resedation far, patient will need continued monitoring until 4 PM.  At that point, without any resedation, patient will be medically cleared and then will await TTS recommendations.  May be a candidate for outpatient rehab.  Signed out to oncoming provider at shift change.  Elmer Sow. Pilar Plate, MD [facility] Emergency Medicine [facility] [email or web address]  Final Clinical Impressions(s) / ED Diagnoses  ICD-10-CM   1. Accidental overdose of heroin, initial encounter ([place])  T40.1X1A   2. Depression, unspecified depression type  F32.9     ED Discharge Orders    None       Discharge Instructions Discussed with and Provided to Patient:   Discharge Instructions   None       Maudie Flakes, MD 09/30/19 1501

## 2019-09-30 NOTE — ED Notes (Signed)
Pt ambulated from bed to BR and back gait steady remains alert x3 requesting food and drink. Pt being moved to hall B bed VSS.

## 2019-10-01 NOTE — ED Notes (Signed)
TTS currently in progress 

## 2019-10-01 NOTE — ED Notes (Signed)
Asked pt to provide urine specimen- pt states she voided recently and cannot void currently.

## 2019-10-01 NOTE — ED Notes (Signed)
[place] signature pad not available for hallway beds. This RN went over patient's discharge paperwork and patient verbalized understanding.

## 2019-10-01 NOTE — BH Assessment (Signed)
Tele Assessment Note   Patient Name: Maria Abbott MRN: [id number] Referring Physician: Dr. Kennis Carina Location of Patient: [place] Location of Provider: Behavioral Health TTS Department  Maria Abbott is an 34 y.o. female presenting with heroin overdose. Patient was brought in by EMS to ED from [facility] bathroom after being found on the floor in respiratory arrest. Patient received 2 mg narcan IN prior to EMS arrival. Patient stated "this was not a suicide attempt". Patient reported being clean for a few days but unfortunately relapsed today. Patient reported she was not trying to harm herself and that she only took a small dose of heroin which was a new batch of heroin that she injected today. Patient reported using $20 worth of heroin daily. Patient reported her main stressors are being homeless, needing food, shelter and warmth. Patient denied SI, HI and psychosis.   Patient denied prior inpatient mental health, suicide attempts and self-harming behaviors. Patient reported 6 months ago receiving services from [facility]. Patient is not receiving any outpatient services at this time. Patient is currently homeless. Patient has 2 boys (12 and 79) whom lives with her mother.   Diagnosis: Opioid Dependence  Past Medical History:  Past Medical History:  Diagnosis Date  . Endocarditis   . Endometriosis   . Hepatitis C   . Substance abuse [facility])     Past Surgical History:  Procedure Laterality Date  . EXTERNAL FIXATION ARM    . I & D EXTREMITY Left 12/01/2016   Procedure: IRRIGATION AND DEBRIDEMENT EXTREMITY;  Surgeon: Knute Neu, MD;  Location: [facility] OR;  Service: Plastics;  Laterality: Left;    Family History:  Family History  Problem Relation Age of Onset  . Emphysema Other     Social History:  reports that she has been smoking cigarettes. She has been smoking about 1.25 packs per day. She has never used smokeless tobacco. She reports current drug use. Drug: IV. She reports that she  does not drink alcohol.  Additional Social History:  Alcohol / Drug Use Pain Medications: see MAR Prescriptions: see MAR Over the Counter: see MAR  CIWA: CIWA-Ar BP: 116/80 Pulse Rate: 77 COWS:    Allergies:  Allergies  Allergen Reactions  . Codeine Hives and Swelling  . Penicillins Hives and Swelling    Did it involve swelling of the face/tongue/throat, SOB, or low BP? Yes Did it involve sudden or severe rash/hives, skin peeling, or any reaction on the inside of your mouth or nose? No Did you need to seek medical attention at a hospital or doctor's office? Unknown When did it last happen?Unknown If all above answers are "NO", may proceed with cephalosporin use.   . Sulfa Antibiotics Hives and Swelling    Home Medications: (Not in a hospital admission)   OB/GYN Status:  No LMP recorded.  General Assessment Data Assessment unable to be completed: Yes Reason for not completing assessment: Pt too sedated to respond to questions Location of Assessment: [facility] ED TTS Assessment: In system Is this a Tele or Face-to-Face Assessment?: Tele Assessment Is this an Initial Assessment or a Re-assessment for this encounter?: Initial Assessment Patient Accompanied by:: N/A Language Other than English: No Living Arrangements: Homeless/Shelter What gender do you identify as?: Female Marital status: Single Pregnancy Status: Unknown Living Arrangements: Alone Can pt return to current living arrangement?: Yes Admission Status: Voluntary Is patient capable of signing voluntary admission?: Yes Referral Source: Other(EMS)     Crisis Care Plan Living Arrangements: Alone Legal Guardian: (self) Name of  Psychiatrist: (none) Name of Therapist: (none)  Education Status Is patient currently in school?: No Is the patient employed, unemployed or receiving disability?: Unemployed  Risk to self with the past 6 months Suicidal Ideation: No Has patient been a risk to self within the  past 6 months prior to admission? : No Suicidal Intent: No Has patient had any suicidal intent within the past 6 months prior to admission? : No Is patient at risk for suicide?: No Suicidal Plan?: No Has patient had any suicidal plan within the past 6 months prior to admission? : No Access to Means: No What has been your use of drugs/alcohol within the last 12 months?: (heroin) Previous Attempts/Gestures: No How many times?: (0) Other Self Harm Risks: (heroin addict) Triggers for Past Attempts: (n/a) Intentional Self Injurious Behavior: None Family Suicide History: No Recent stressful life event(s): Other (Comment)(homeless) Persecutory voices/beliefs?: No Depression: No Depression Symptoms: (denied) Substance abuse history and/or treatment for substance abuse?: No Suicide prevention information given to non-admitted patients: Not applicable  Risk to Others within the past 6 months Homicidal Ideation: No Does patient have any lifetime risk of violence toward others beyond the six months prior to admission? : No Thoughts of Harm to Others: No Current Homicidal Intent: No Current Homicidal Plan: No Access to Homicidal Means: No Identified Victim: (none) History of harm to others?: No Assessment of Violence: None Noted Violent Behavior Description: (none ) Does patient have access to weapons?: No Criminal Charges Pending?: No Does patient have a court date: No Is patient on probation?: No  Psychosis Hallucinations: None noted Delusions: None noted  Mental Status Report Appearance/Hygiene: Unremarkable Eye Contact: Good Motor Activity: Freedom of movement Speech: Logical/coherent Level of Consciousness: Alert Mood: Anxious Affect: Anxious, Appropriate to circumstance Anxiety Level: Moderate Thought Processes: Coherent, Relevant Judgement: Partial Orientation: Person, Place, Time, Situation Obsessive Compulsive Thoughts/Behaviors: None  Cognitive  Functioning Concentration: Good Memory: Recent Intact Is patient IDD: No Insight: Fair Impulse Control: Fair Appetite: Fair Have you had any weight changes? : No Change Sleep: No Change Total Hours of Sleep: ("not much") Vegetative Symptoms: None  ADLScreening [facility] Assessment Services) Patient's cognitive ability adequate to safely complete daily activities?: Yes Patient able to express need for assistance with ADLs?: Yes Independently performs ADLs?: Yes (appropriate for developmental age)  Prior Inpatient Therapy Prior Inpatient Therapy: No  Prior Outpatient Therapy Prior Outpatient Therapy: Yes Prior Therapy Dates: (6 months ago) Prior Therapy Facilty/Provider(s): ([facility]) Reason for Treatment: (heroin detox) Does patient have an ACCT team?: No Does patient have Intensive In-House Services?  : No Does patient have [facility] services? : No Does patient have P4CC services?: No  ADL Screening (condition at time of admission) Patient's cognitive ability adequate to safely complete daily activities?: Yes Patient able to express need for assistance with ADLs?: Yes Independently performs ADLs?: Yes (appropriate for developmental age)  Regulatory affairs officer (For Healthcare) Does Patient Have a Medical Advance Directive?: No Would patient like information on creating a medical advance directive?: Yes (ED - Information included in AVS)   Disposition:  Disposition Initial Assessment Completed for this Encounter: Yes  Talbot Grumbling, NP, psych cleared. Patient to follow up with [facility] and outpatient resources given to patient. RN informed of disposition.  This service was provided via telemedicine using a 2-way, interactive audio and video technology.  Names of all persons participating in this telemedicine service and their role in this encounter. Name: Maria Abbott Role: Patient  Name: Kirtland Bouchard Role: TTS Clinician  Name:  Role:   Name:  Role:     Burnetta Sabin 10/01/2019 3:40 AM

## 2019-10-01 NOTE — ED Notes (Signed)
TTS called r/t assessment Counselor states will assess pt

## 2019-10-01 NOTE — ED Notes (Signed)
TTS called r/t assessment Counselor states will assess pt
